# Patient Record
Sex: Male | Born: 1965 | Race: White | Hispanic: No | Marital: Married | Smoking: Former smoker
Health system: Southern US, Community
[De-identification: ages and names within clinical notes are randomized; demographics above are authoritative.]

## PROBLEM LIST (undated history)

## (undated) DIAGNOSIS — T148XXA Other injury of unspecified body region, initial encounter: Secondary | ICD-10-CM

## (undated) DIAGNOSIS — J189 Pneumonia, unspecified organism: Secondary | ICD-10-CM

## (undated) DIAGNOSIS — Z8489 Family history of other specified conditions: Secondary | ICD-10-CM

## (undated) DIAGNOSIS — I10 Essential (primary) hypertension: Secondary | ICD-10-CM

## (undated) HISTORY — PX: MULTIPLE TOOTH EXTRACTIONS: SHX2053

## (undated) HISTORY — DX: Essential (primary) hypertension: I10

---

## 2017-03-27 ENCOUNTER — Ambulatory Visit: Payer: Self-pay | Admitting: Internal Medicine

## 2017-03-27 DIAGNOSIS — I10 Essential (primary) hypertension: Secondary | ICD-10-CM | POA: Insufficient documentation

## 2017-03-27 DIAGNOSIS — E663 Overweight: Secondary | ICD-10-CM | POA: Insufficient documentation

## 2017-04-25 ENCOUNTER — Other Ambulatory Visit: Payer: Self-pay | Admitting: Internal Medicine

## 2017-04-26 ENCOUNTER — Other Ambulatory Visit: Payer: Self-pay

## 2017-05-03 ENCOUNTER — Ambulatory Visit: Payer: Self-pay | Admitting: Nurse Practitioner

## 2017-05-03 ENCOUNTER — Encounter: Payer: Self-pay | Admitting: Nurse Practitioner

## 2017-05-03 VITALS — BP 142/82 | HR 79 | Resp 16 | Ht 71.0 in | Wt 220.0 lb

## 2017-05-03 DIAGNOSIS — E782 Mixed hyperlipidemia: Secondary | ICD-10-CM

## 2017-05-03 DIAGNOSIS — I10 Essential (primary) hypertension: Secondary | ICD-10-CM

## 2017-05-03 MED ORDER — HYDROCHLOROTHIAZIDE 25 MG PO TABS: 25.0000 mg | ORAL_TABLET | Freq: Every morning | ORAL | 4 refills | Status: DC

## 2017-05-03 MED ORDER — LOSARTAN POTASSIUM 25 MG PO TABS: 25.0000 mg | ORAL_TABLET | Freq: Every morning | ORAL | 4 refills | Status: DC

## 2017-05-03 MED ORDER — AMLODIPINE BESYLATE 10 MG PO TABS: 10.0000 mg | ORAL_TABLET | Freq: Every day | ORAL | 4 refills | Status: DC

## 2017-05-03 NOTE — Progress Notes (Signed)
[facility] [address] [place], [place] [address]  Internal MEDICINE  Office Visit Note  Patient Name: Jordan Cross  [id number]  [id number]  Date of Service: 05/13/2017  Chief Complaint  Patient presents with  . Hypertension    Hypertension  This is a chronic problem. The current episode started more than 1 year ago. The problem is unchanged. The problem is controlled. Pertinent negatives include no chest pain, neck pain, palpitations or shortness of breath. Risk factors for coronary artery disease include obesity and stress. The current treatment provides moderate improvement. There are no compliance problems.     Pt is here for routine follow up.    Current Medication: Outpatient Encounter Medications as of 05/03/2017  Medication Sig  . hydrochlorothiazide (HYDRODIURIL) 25 MG tablet Take 1 tablet (25 mg total) by mouth every morning.  Marland Kitchen losartan (COZAAR) 25 MG tablet Take 1 tablet (25 mg total) by mouth every morning.  . [DISCONTINUED] hydrochlorothiazide (HYDRODIURIL) 25 MG tablet TAKE 1 TABLET BY MOUTH EVERY MORNING  . [DISCONTINUED] losartan (COZAAR) 25 MG tablet TAKE 1 TABLET BY MOUTH EVERY MORNING  . amLODipine (NORVASC) 10 MG tablet Take 1 tablet (10 mg total) by mouth daily.  . [DISCONTINUED] amLODipine (NORVASC) 10 MG tablet TAKE 1 TABLET BY MOUTH ONCE A DAY   No facility-administered encounter medications on file as of 05/03/2017.     Surgical History: History reviewed. No pertinent surgical history.  Medical History: Past Medical History:  Diagnosis Date  . Hypertension     Family History: No family history on file.  Social History   Socioeconomic History  . Marital status: Unknown    Spouse name: Not on file  . Number of children: Not on file  . Years of education: Not on file  . Highest education level: Not on file  Social Needs  . Financial resource strain: Not on file  . Food insecurity - worry: Not on file  . Food insecurity -  inability: Not on file  . Transportation needs - medical: Not on file  . Transportation needs - non-medical: Not on file  Occupational History  . Not on file  Tobacco Use  . Smoking status: Never Smoker  . Smokeless tobacco: Never Used  Substance and Sexual Activity  . Alcohol use: No    Frequency: Never  . Drug use: No  . Sexual activity: Not on file  Other Topics Concern  . Not on file  Social History Narrative  . Not on file      Review of Systems  Constitutional: Negative for activity change, chills, fatigue and unexpected weight change.  HENT: Negative for congestion, postnasal drip, rhinorrhea, sneezing and sore throat.   Eyes: Negative.  Negative for redness.  Respiratory: Negative for cough, chest tightness and shortness of breath.   Cardiovascular: Negative for chest pain and palpitations.       Blood pressure mildly elevated   Gastrointestinal: Negative for abdominal pain, constipation, diarrhea, nausea and vomiting.  Endocrine: Negative for cold intolerance, heat intolerance, polydipsia, polyphagia and polyuria.  Genitourinary: Negative for dysuria and frequency.  Musculoskeletal: Negative for arthralgias, back pain, joint swelling and neck pain.  Skin: Negative for rash.  Allergic/Immunologic: Negative for environmental allergies, food allergies and immunocompromised state.  Neurological: Negative.  Negative for tremors and numbness.  Hematological: Negative for adenopathy. Does not bruise/bleed easily.  Psychiatric/Behavioral: Negative for behavioral problems (Depression), sleep disturbance and suicidal ideas. The patient is not nervous/anxious.     Today's Vitals  05/03/17 1102 05/03/17 1123  BP: (!) 148/80 (!) 142/82  Pulse: 79   Resp: 16   SpO2: 95%   Weight: 220 lb (99.8 kg)   Height: 5\' 11"  (1.803 m)    Physical Exam  Constitutional: He is oriented to person, place, and time. He appears well-developed and well-nourished. No distress.  HENT:  Head:  Normocephalic and atraumatic.  Mouth/Throat: Oropharynx is clear and moist. No oropharyngeal exudate.  Eyes: EOM are normal. Pupils are equal, round, and reactive to light.  Neck: Normal range of motion. Neck supple. No JVD present. No tracheal deviation present. No thyromegaly present.  Cardiovascular: Normal rate, regular rhythm and normal heart sounds. Exam reveals no gallop and no friction rub.  No murmur heard. Pulmonary/Chest: Effort normal and breath sounds normal. No respiratory distress. He has no wheezes. He has no rales. He exhibits no tenderness.  Abdominal: Soft. Bowel sounds are normal. There is no tenderness.  Musculoskeletal: Normal range of motion.  Lymphadenopathy:    He has no cervical adenopathy.  Neurological: He is alert and oriented to person, place, and time. No cranial nerve deficit.  Skin: Skin is warm and dry. He is not diaphoretic.  Psychiatric: He has a normal mood and affect. His behavior is normal. Judgment and thought content normal.  Nursing note and vitals reviewed.   Assessment/Plan:  1. Essential hypertension Stable and improved on medication. Continue as prescribed. Refills provided today. - amLODipine (NORVASC) 10 MG tablet; Take 1 tablet (10 mg total) by mouth daily.  Dispense: 90 tablet; Refill: 4 - hydrochlorothiazide (HYDRODIURIL) 25 MG tablet; Take 1 tablet (25 mg total) by mouth every morning.  Dispense: 90 tablet; Refill: 4 - losartan (COZAAR) 25 MG tablet; Take 1 tablet (25 mg total) by mouth every morning.  Dispense: 90 tablet; Refill: 4  2. Mixed hyperlipidemia Reviewed labs showing mild increase of LDL and total cholesterol. Discussed lifestyle modifications to lower cholesterol without channging medications. Will recheck in 6-12 months.   General Counseling: Jordan Cross verbalizes understanding of the findings of todays visit and agrees with plan of treatment. I have discussed any further diagnostic evaluation that may be needed or ordered today.  We also reviewed his medications today. he has been encouraged to call the office with any questions or concerns that should arise related to todays visit.   Hypertension Counseling:   The following hypertensive lifestyle modification were recommended and discussed:  1. Limiting alcohol intake to less than 1 oz/day of ethanol:(24 oz of beer or 8 oz of wine or 2 oz of 100-proof whiskey). 2. Take baby ASA 81 mg daily. 3. Importance of regular aerobic exercise and losing weight. 4. Reduce dietary saturated fat and cholesterol intake for overall cardiovascular health. 5. Maintaining adequate dietary potassium, calcium, and magnesium intake. 6. Regular monitoring of the blood pressure. 7. Reduce sodium intake to less than 100 mmol/day (less than 2.3 gm of sodium or less than 6 gm of sodium choride)    This patient was seen by Vincent Gros, FNP- C in Collaboration with Dr Lyndon Code as a part of collaborative care agreement  Meds ordered this encounter  Medications  . amLODipine (NORVASC) 10 MG tablet    Sig: Take 1 tablet (10 mg total) by mouth daily.    Dispense:  90 tablet    Refill:  4    Order Specific Question:   Supervising Provider    Answer:   Lyndon Code [[id number]]  . hydrochlorothiazide (HYDRODIURIL) 25 MG tablet  Sig: Take 1 tablet (25 mg total) by mouth every morning.    Dispense:  90 tablet    Refill:  4    Order Specific Question:   Supervising Provider    Answer:   Lyndon CodeKHAN, FOZIA M [[id number]]  . losartan (COZAAR) 25 MG tablet    Sig: Take 1 tablet (25 mg total) by mouth every morning.    Dispense:  90 tablet    Refill:  4    Order Specific Question:   Supervising Provider    Answer:   Lyndon CodeKHAN, FOZIA M [[id number]]    Time spent:15 Minutes   Dr Lyndon CodeFozia M Khan Internal medicine

## 2017-05-13 DIAGNOSIS — E782 Mixed hyperlipidemia: Secondary | ICD-10-CM | POA: Insufficient documentation

## 2017-11-06 ENCOUNTER — Ambulatory Visit: Payer: Self-pay | Admitting: Adult Health

## 2017-11-20 ENCOUNTER — Emergency Department (HOSPITAL_COMMUNITY): Payer: Self-pay

## 2017-11-20 ENCOUNTER — Encounter (HOSPITAL_COMMUNITY): Payer: Self-pay | Admitting: *Deleted

## 2017-11-20 ENCOUNTER — Emergency Department (HOSPITAL_COMMUNITY)
Admission: EM | Admit: 2017-11-20 | Discharge: 2017-11-20 | Disposition: A | Discharge status: Home / Self Care | Payer: Self-pay | Attending: Emergency Medicine | Admitting: Emergency Medicine

## 2017-11-20 DIAGNOSIS — Y939 Activity, unspecified: Secondary | ICD-10-CM | POA: Insufficient documentation

## 2017-11-20 DIAGNOSIS — Z79899 Other long term (current) drug therapy: Secondary | ICD-10-CM | POA: Insufficient documentation

## 2017-11-20 DIAGNOSIS — S82844A Nondisplaced bimalleolar fracture of right lower leg, initial encounter for closed fracture: Secondary | ICD-10-CM | POA: Insufficient documentation

## 2017-11-20 DIAGNOSIS — Y929 Unspecified place or not applicable: Secondary | ICD-10-CM | POA: Insufficient documentation

## 2017-11-20 DIAGNOSIS — I10 Essential (primary) hypertension: Secondary | ICD-10-CM | POA: Insufficient documentation

## 2017-11-20 DIAGNOSIS — S82841A Displaced bimalleolar fracture of right lower leg, initial encounter for closed fracture: Secondary | ICD-10-CM

## 2017-11-20 DIAGNOSIS — Y999 Unspecified external cause status: Secondary | ICD-10-CM | POA: Insufficient documentation

## 2017-11-20 DIAGNOSIS — W11XXXA Fall on and from ladder, initial encounter: Secondary | ICD-10-CM | POA: Insufficient documentation

## 2017-11-20 MED ORDER — OXYCODONE-ACETAMINOPHEN 5-325 MG PO TABS
1.0000 | ORAL_TABLET | Freq: Once | ORAL | Status: AC
  Administered 2017-11-20: 1 via ORAL
  Filled 2017-11-20: qty 1

## 2017-11-20 MED ORDER — OXYCODONE-ACETAMINOPHEN 5-325 MG PO TABS: 1.0000 | ORAL_TABLET | ORAL | 0 refills | Status: AC | PRN

## 2017-11-20 NOTE — ED Notes (Signed)
Declined W/C at D/C and was escorted to lobby by RN. 

## 2017-11-20 NOTE — ED Triage Notes (Signed)
Pt has not actually arrived to hospital

## 2017-11-20 NOTE — Consult Note (Signed)
Reason for Consult:Right ankle fx Referring Physician: P Messick  Jordan Cross is an 52 y.o. male.  HPI: Jordan Cross was on a ladder and fell about 7 feet. He had immediate pain and came to the ED for evaluation. X-rays showed a bimalleolar ankle fx and orthopedic surgery was consulted.  Past Medical History:  Diagnosis Date  . Hypertension     History reviewed. No pertinent surgical history.  History reviewed. No pertinent family history.  Social History:  reports that he has never smoked. He has never used smokeless tobacco. He reports that he does not drink alcohol or use drugs.  Allergies:  Allergies  Allergen Reactions  . Penicillins     Medications: I have reviewed the patient's current medications.  No results found for this or any previous visit (from the past 48 hour(s)).  Dg Tibia/fibula Right  Result Date: 11/20/2017 CLINICAL DATA:  Pt c/o generalized right ankle pain and swelling and distal lower right leg pain and swelling after falling off a 7 foot ladder today. Hx of prior right ankle fracture. EXAM: RIGHT TIBIA AND FIBULA - 2 VIEW COMPARISON:  None. FINDINGS: Avulsion fracture of the medial malleolus, major fracture fragment distracted approximately 6 mm. Fracture of the lateral malleolus below the level of the tibiotalar articulation, fragment displaced approximately 5 mm. Lateral and medial soft tissue swelling. Posterior malleolus appears intact. The ankle mortise appears intact on these nonstress views. Small calcaneal spurs.Small marginal spurs from the patellar articular surface. Proximal tibia and fibula intact. IMPRESSION: 1. Distracted medial malleolus fracture. 2. Minimally displaced fracture of the distal lateral malleolus. Electronically Signed   By: Corlis Leak M.D.   On: 11/20/2017 15:46   Dg Ankle Complete Right  Result Date: 11/20/2017 CLINICAL DATA:  Pt c/o generalized right ankle pain and swelling and distal lower right leg pain and swelling after falling off a  7 foot ladder today. Hx of prior right ankle fracture. EXAM: RIGHT ANKLE - COMPLETE 3+ VIEW COMPARISON:  None available FINDINGS: Avulsion fracture of the medial malleolus, major fracture fragment distracted approximately 6 mm. Fracture of the lateral malleolus below the level of the tibiotalar articulation, fragment displaced approximately 5 mm. Lateral and medial soft tissue swelling. Posterior malleolus appears intact. The ankle mortise appears intact on these nonstress views. Small calcaneal spurs. IMPRESSION: 1. Distracted medial malleolus  fracture. 2. Minimally displaced fracture of the distal lateral malleolus. Electronically Signed   By: Corlis Leak M.D.   On: 11/20/2017 15:39    Review of Systems  Constitutional: Negative for weight loss.  HENT: Negative for ear discharge, ear pain, hearing loss and tinnitus.   Eyes: Negative for blurred vision, double vision, photophobia and pain.  Respiratory: Negative for cough, sputum production and shortness of breath.   Cardiovascular: Negative for chest pain.  Gastrointestinal: Negative for abdominal pain, nausea and vomiting.  Genitourinary: Negative for dysuria, flank pain, frequency and urgency.  Musculoskeletal: Positive for joint pain (Right ankle). Negative for back pain, falls, myalgias and neck pain.  Neurological: Negative for dizziness, tingling, sensory change, focal weakness, loss of consciousness and headaches.  Endo/Heme/Allergies: Does not bruise/bleed easily.  Psychiatric/Behavioral: Negative for depression, memory loss and substance abuse. The patient is not nervous/anxious.    Blood pressure 113/83, pulse 77, temperature 98.1 F (36.7 C), temperature source Oral, resp. rate 16, height 5\' 10"  (1.778 m), weight 95.3 kg, SpO2 100 %. Physical Exam  Constitutional: He appears well-developed and well-nourished. No distress.  HENT:  Head: Normocephalic and atraumatic.  Eyes: Conjunctivae are normal. Right eye exhibits no discharge.  Left eye exhibits no discharge. No scleral icterus.  Neck: Normal range of motion.  Cardiovascular: Normal rate and regular rhythm.  Respiratory: Effort normal. No respiratory distress.  Musculoskeletal:  RLE No traumatic wounds, ecchymosis, or rash  Ankle TTP, severe edema  No knee effusion  Knee stable to varus/ valgus and anterior/posterior stress  Sens DPN, SPN, TN intact  Motor EHL, ext, flex, evers 5/5  DP 2+  Neurological: He is alert.  Skin: Skin is warm and dry. He is not diaphoretic.  Psychiatric: He has a normal mood and affect. His behavior is normal.    Assessment/Plan: Right ankle fx -- Short leg splint and NWB with crutches. F/u with Dr. Carola Frost next week.     Freeman Caldron, PA-C Orthopedic Surgery [phone] 11/20/2017, 4:13 PM

## 2017-11-20 NOTE — Discharge Instructions (Addendum)
Keep splint intact and dry.  Do not put weight down on right foot.  Keep ankle elevated whenever possible.  Ice (over splint) to ankle for 30 minutes 4x/day through Thursday.

## 2017-11-20 NOTE — ED Triage Notes (Signed)
Pt in after a fall from a ladder and landed on his right ankle, swelling noted, denies hitting head or LOC

## 2017-11-20 NOTE — ED Provider Notes (Addendum)
[facility] EMERGENCY DEPARTMENT Provider Note   CSN: [id number] Arrival date & time: 11/20/17  1352     History   Chief Complaint Chief Complaint  Patient presents with  . Fall    HPI Jordan Cross is a 52 y.o. male.  52 y/o male with a PMH of HTN presents to the ED s/p fall from 6 foot ladder x 2 hours.  Patient was standing on his ladder when he felt the ladder gave out from under him causing him to fall on the right side of his body as the ladder flipped on top of him he states most of his right ankle caught the fall.  He reports right ankle pain, patient is unable to weight-bear.  Reports has not taken occasion for symptomatic relief.  Denies hitting his head, LOC, shortness of breath or other complaints.       Past Medical History:  Diagnosis Date  . Hypertension     Patient Active Problem List   Diagnosis Date Noted  . Mixed hyperlipidemia 05/13/2017  . Overweight 03/27/2017  . Essential hypertension 03/27/2017    History reviewed. No pertinent surgical history.      Home Medications    Prior to Admission medications   Medication Sig Start Date End Date Taking? Authorizing Provider  amLODipine (NORVASC) 10 MG tablet Take 1 tablet (10 mg total) by mouth daily. 05/03/17   Carlean Jews, NP  hydrochlorothiazide (HYDRODIURIL) 25 MG tablet Take 1 tablet (25 mg total) by mouth every morning. 05/03/17   Carlean Jews, NP  losartan (COZAAR) 25 MG tablet Take 1 tablet (25 mg total) by mouth every morning. 05/03/17   Carlean Jews, NP  oxyCODONE-acetaminophen (PERCOCET/ROXICET) 5-325 MG tablet Take 1 tablet by mouth every 4 (four) hours as needed for up to 3 days for severe pain. 11/20/17 11/23/17  Claude Manges, PA-C    Family History History reviewed. No pertinent family history.  Social History Social History   Tobacco Use  . Smoking status: Never Smoker  . Smokeless tobacco: Never Used  Substance Use Topics  . Alcohol use: No   Frequency: Never  . Drug use: No     Allergies   Penicillins   Review of Systems Review of Systems  Musculoskeletal: Positive for arthralgias, joint swelling and myalgias.  Neurological: Negative for light-headedness.  All other systems reviewed and are negative.    Physical Exam Updated Vital Signs BP 113/83 (BP Location: Left Arm)   Pulse 77   Temp 98.1 F (36.7 C) (Oral)   Resp 16   Ht 5\' 10"  (1.778 m)   Wt 95.3 kg   SpO2 100%   BMI 30.13 kg/m   Physical Exam  Constitutional: He appears well-developed and well-nourished.  HENT:  Head: Normocephalic and atraumatic.  Eyes: Pupils are equal, round, and reactive to light.  Neck: Normal range of motion. Neck supple.  Cardiovascular: Normal heart sounds.  Pulmonary/Chest: Effort normal and breath sounds normal. He has no wheezes.  Abdominal: Soft.  Musculoskeletal: He exhibits tenderness and deformity.       Right ankle: He exhibits swelling and deformity. He exhibits normal range of motion, no ecchymosis, no laceration and normal pulse.       Feet:  Patient exhibits pain with range of motion.  Is present.  Capillary refill intact.  Compartment is soft.  Neurological: He is alert.  Skin: Skin is warm and dry. Capillary refill takes less than 2 seconds.  Nursing note and  vitals reviewed.    ED Treatments / Results  Labs (all labs ordered are listed, but only abnormal results are displayed) Labs Reviewed - No data to display  EKG None  Radiology Dg Tibia/fibula Right  Result Date: 11/20/2017 CLINICAL DATA:  Pt c/o generalized right ankle pain and swelling and distal lower right leg pain and swelling after falling off a 7 foot ladder today. Hx of prior right ankle fracture. EXAM: RIGHT TIBIA AND FIBULA - 2 VIEW COMPARISON:  None. FINDINGS: Avulsion fracture of the medial malleolus, major fracture fragment distracted approximately 6 mm. Fracture of the lateral malleolus below the level of the tibiotalar  articulation, fragment displaced approximately 5 mm. Lateral and medial soft tissue swelling. Posterior malleolus appears intact. The ankle mortise appears intact on these nonstress views. Small calcaneal spurs.Small marginal spurs from the patellar articular surface. Proximal tibia and fibula intact. IMPRESSION: 1. Distracted medial malleolus fracture. 2. Minimally displaced fracture of the distal lateral malleolus. Electronically Signed   By: Corlis Leak M.D.   On: 11/20/2017 15:46   Dg Ankle Complete Right  Result Date: 11/20/2017 CLINICAL DATA:  Pt c/o generalized right ankle pain and swelling and distal lower right leg pain and swelling after falling off a 7 foot ladder today. Hx of prior right ankle fracture. EXAM: RIGHT ANKLE - COMPLETE 3+ VIEW COMPARISON:  None available FINDINGS: Avulsion fracture of the medial malleolus, major fracture fragment distracted approximately 6 mm. Fracture of the lateral malleolus below the level of the tibiotalar articulation, fragment displaced approximately 5 mm. Lateral and medial soft tissue swelling. Posterior malleolus appears intact. The ankle mortise appears intact on these nonstress views. Small calcaneal spurs. IMPRESSION: 1. Distracted medial malleolus  fracture. 2. Minimally displaced fracture of the distal lateral malleolus. Electronically Signed   By: Corlis Leak M.D.   On: 11/20/2017 15:39    Procedures Procedures (including critical care time)  Medications Ordered in ED Medications  oxyCODONE-acetaminophen (PERCOCET/ROXICET) 5-325 MG per tablet 1 tablet (1 tablet Oral Given 11/20/17 1517)     Initial Impression / Assessment and Plan / ED Course  I have reviewed the triage vital signs and the nursing notes.  Pertinent labs & imaging results that were available during my care of the patient were reviewed by me and considered in my medical decision making (see chart for details).     Patient presents s/p fall from a ladder this afternoon. Upon  examination patient is  Reports swelling and bruising noted along right ankle. He is unable to ambulate at this time,patient received percocet for pain in while waiting for imaging.  DG right ankle showed showed distracted medial malleolus fracture and minimally displaced fracture of the distal lateral malleolus.  Time I have placed a call to Dr. Eston Esters PA Casimiro Needle to come in and evaluate patient.  4:05 PM Spoke to Charma Igo PA to come in and evaluate patient in the ED. 4:41 PM Charma Igo PA has ordered patient a short splint and crutches,patient will need to see Dr. Susette Racer next week in office.    Final Clinical Impressions(s) / ED Diagnoses   Final diagnoses:  Ankle fracture, bimalleolar, closed, right, initial encounter    ED Discharge Orders         Ordered    oxyCODONE-acetaminophen (PERCOCET/ROXICET) 5-325 MG tablet  Every 4 hours PRN     11/20/17 1701           Claude Manges, PA-C 11/20/17 1658    Claude Manges, PA-C 11/20/17  [address]    Claude Manges, PA-C 11/20/17 1701    Rolan Bucco, MD 11/20/17 [phone]

## 2017-11-20 NOTE — Progress Notes (Signed)
Orthopedic Tech Progress Note Patient Details:  Jordan Cross Dec 15, 1965 [id number]  Ortho Devices Type of Ortho Device: Ace wrap, Post (short leg) splint, Stirrup splint, Crutches Ortho Device/Splint Interventions: Application   Post Interventions Patient Tolerated: Well Instructions Provided: Care of device   Saul Fordyce 11/20/2017, 5:10 PM

## 2017-12-10 ENCOUNTER — Other Ambulatory Visit: Payer: Self-pay

## 2017-12-10 ENCOUNTER — Encounter (HOSPITAL_COMMUNITY): Payer: Self-pay | Admitting: *Deleted

## 2017-12-10 NOTE — Progress Notes (Signed)
Pt denies SOB, chest pain, and being under the care of a cardiologist. Pt stated that a stress test was performed " 20 years ago." Pt denies having an echo and cardiac cath. Pt denies having an EKG and chest x ray within the last year. Pt denies recent labs. Pt made aware to stop taking vitamins, fish oil and herbal medications. Do not take any NSAIDs ie: Ibuprofen, Advil, Naproxen (Aleve), Motrin, BC and Goody Powder. Pt stated that he does not take Aspirin. Pt verbalized understanding of all pre-op instructions.

## 2017-12-10 NOTE — Anesthesia Preprocedure Evaluation (Addendum)
Anesthesia Evaluation  Patient identified by MRN, date of birth, ID band Patient awake    Reviewed: Allergy & Precautions, NPO status , Patient's Chart, lab work & pertinent test results  History of Anesthesia Complications Negative for: history of anesthetic complications  Airway Mallampati: II  TM Distance: >3 FB Neck ROM: Full    Dental  (+) Dental Advisory Given, Chipped,    Pulmonary neg pulmonary ROS,    breath sounds clear to auscultation       Cardiovascular hypertension, Pt. on medications  Rhythm:Regular Rate:Normal     Neuro/Psych negative neurological ROS  negative psych ROS   GI/Hepatic negative GI ROS, Neg liver ROS,   Endo/Other   Obesity   Renal/GU negative Renal ROS  negative genitourinary   Musculoskeletal negative musculoskeletal ROS (+)   Abdominal   Peds  Hematology negative hematology ROS (+)   Anesthesia Other Findings   Reproductive/Obstetrics                           Anesthesia Physical Anesthesia Plan  ASA: II  Anesthesia Plan: General   Post-op Pain Management:  Regional for Post-op pain   Induction: Intravenous  PONV Risk Score and Plan: 2 and Treatment may vary due to age or medical condition, Ondansetron, Dexamethasone and Midazolam  Airway Management Planned: LMA  Additional Equipment: None  Intra-op Plan:   Post-operative Plan: Extubation in OR  Informed Consent: I have reviewed the patients History and Physical, chart, labs and discussed the procedure including the risks, benefits and alternatives for the proposed anesthesia with the patient or authorized representative who has indicated his/her understanding and acceptance.   Dental advisory given  Plan Discussed with: CRNA and Anesthesiologist  Anesthesia Plan Comments:        Anesthesia Quick Evaluation

## 2017-12-11 ENCOUNTER — Ambulatory Visit (HOSPITAL_COMMUNITY): Payer: Self-pay

## 2017-12-11 ENCOUNTER — Encounter (HOSPITAL_COMMUNITY)
Admission: RE | Disposition: A | Discharge status: Home / Self Care | Payer: Self-pay | Source: Ambulatory Visit | Attending: Orthopedic Surgery

## 2017-12-11 ENCOUNTER — Ambulatory Visit (HOSPITAL_COMMUNITY): Payer: Self-pay | Admitting: Anesthesiology

## 2017-12-11 ENCOUNTER — Encounter (HOSPITAL_COMMUNITY): Payer: Self-pay | Admitting: *Deleted

## 2017-12-11 ENCOUNTER — Ambulatory Visit (HOSPITAL_COMMUNITY)
Admission: RE | Admit: 2017-12-11 | Discharge: 2017-12-11 | Disposition: A | Discharge status: Home / Self Care | Payer: Self-pay | Source: Ambulatory Visit | Attending: Orthopedic Surgery | Admitting: Orthopedic Surgery

## 2017-12-11 DIAGNOSIS — S82871A Displaced pilon fracture of right tibia, initial encounter for closed fracture: Secondary | ICD-10-CM | POA: Insufficient documentation

## 2017-12-11 DIAGNOSIS — S82844A Nondisplaced bimalleolar fracture of right lower leg, initial encounter for closed fracture: Secondary | ICD-10-CM | POA: Insufficient documentation

## 2017-12-11 DIAGNOSIS — Z419 Encounter for procedure for purposes other than remedying health state, unspecified: Secondary | ICD-10-CM

## 2017-12-11 DIAGNOSIS — W11XXXA Fall on and from ladder, initial encounter: Secondary | ICD-10-CM | POA: Insufficient documentation

## 2017-12-11 DIAGNOSIS — Z6830 Body mass index (BMI) 30.0-30.9, adult: Secondary | ICD-10-CM | POA: Insufficient documentation

## 2017-12-11 DIAGNOSIS — S92191A Other fracture of right talus, initial encounter for closed fracture: Secondary | ICD-10-CM | POA: Insufficient documentation

## 2017-12-11 DIAGNOSIS — E669 Obesity, unspecified: Secondary | ICD-10-CM | POA: Insufficient documentation

## 2017-12-11 HISTORY — DX: Family history of other specified conditions: Z84.89

## 2017-12-11 HISTORY — DX: Other injury of unspecified body region, initial encounter: T14.8XXA

## 2017-12-11 HISTORY — PX: ORIF ANKLE FRACTURE: SHX5408

## 2017-12-11 HISTORY — DX: Pneumonia, unspecified organism: J18.9

## 2017-12-11 LAB — CBC
HEMATOCRIT: 46.2 % (ref 39.0–52.0)
Hemoglobin: 15.8 g/dL (ref 13.0–17.0)
MCH: 30 pg (ref 26.0–34.0)
MCHC: 34.2 g/dL (ref 30.0–36.0)
MCV: 87.7 fL (ref 78.0–100.0)
Platelets: 287 10*3/uL (ref 150–400)
RBC: 5.27 MIL/uL (ref 4.22–5.81)
RDW: 11.5 % (ref 11.5–15.5)
WBC: 8.2 10*3/uL (ref 4.0–10.5)

## 2017-12-11 SURGERY — OPEN REDUCTION INTERNAL FIXATION (ORIF) ANKLE FRACTURE
Anesthesia: General | Laterality: Right

## 2017-12-11 MED ORDER — ACETAMINOPHEN 500 MG PO TABS: 500.0000 mg | ORAL_TABLET | Freq: Three times a day (TID) | ORAL | 0 refills | Status: DC

## 2017-12-11 MED ORDER — ONDANSETRON HCL 4 MG/2ML IJ SOLN
INTRAMUSCULAR | Status: AC
  Filled 2017-12-11: qty 2

## 2017-12-11 MED ORDER — PHENYLEPHRINE 40 MCG/ML (10ML) SYRINGE FOR IV PUSH (FOR BLOOD PRESSURE SUPPORT)
PREFILLED_SYRINGE | INTRAVENOUS | Status: DC | PRN
  Administered 2017-12-11: 80 ug via INTRAVENOUS
  Administered 2017-12-11 (×2): 40 ug via INTRAVENOUS
  Administered 2017-12-11 (×2): 120 ug via INTRAVENOUS

## 2017-12-11 MED ORDER — DEXAMETHASONE SODIUM PHOSPHATE 10 MG/ML IJ SOLN
INTRAMUSCULAR | Status: DC | PRN
  Administered 2017-12-11: 10 mg via INTRAVENOUS

## 2017-12-11 MED ORDER — SCOPOLAMINE 1 MG/3DAYS TD PT72
MEDICATED_PATCH | TRANSDERMAL | Status: DC | PRN
  Administered 2017-12-11: 1 via TRANSDERMAL

## 2017-12-11 MED ORDER — ONDANSETRON 4 MG PO TBDP: 4.0000 mg | ORAL_TABLET | Freq: Three times a day (TID) | ORAL | 0 refills | Status: DC | PRN

## 2017-12-11 MED ORDER — ACETAMINOPHEN 500 MG PO TABS
1000.0000 mg | ORAL_TABLET | Freq: Once | ORAL | Status: AC
  Administered 2017-12-11: 1000 mg via ORAL
  Filled 2017-12-11: qty 2

## 2017-12-11 MED ORDER — MIDAZOLAM HCL 2 MG/2ML IJ SOLN
INTRAMUSCULAR | Status: AC
  Filled 2017-12-11: qty 2

## 2017-12-11 MED ORDER — FENTANYL CITRATE (PF) 100 MCG/2ML IJ SOLN
INTRAMUSCULAR | Status: DC | PRN
  Administered 2017-12-11: 50 ug via INTRAVENOUS
  Administered 2017-12-11: 100 ug via INTRAVENOUS

## 2017-12-11 MED ORDER — PHENYLEPHRINE 40 MCG/ML (10ML) SYRINGE FOR IV PUSH (FOR BLOOD PRESSURE SUPPORT)
PREFILLED_SYRINGE | INTRAVENOUS | Status: AC
  Filled 2017-12-11: qty 10

## 2017-12-11 MED ORDER — ONDANSETRON HCL 4 MG/2ML IJ SOLN
INTRAMUSCULAR | Status: DC | PRN
  Administered 2017-12-11: 4 mg via INTRAVENOUS

## 2017-12-11 MED ORDER — OXYCODONE HCL 5 MG PO TABS: 5.0000 mg | ORAL_TABLET | Freq: Once | ORAL | Status: DC | PRN

## 2017-12-11 MED ORDER — PROMETHAZINE HCL 25 MG/ML IJ SOLN: 6.2500 mg | INTRAMUSCULAR | Status: DC | PRN

## 2017-12-11 MED ORDER — SCOPOLAMINE 1 MG/3DAYS TD PT72
MEDICATED_PATCH | TRANSDERMAL | Status: AC
  Filled 2017-12-11: qty 1

## 2017-12-11 MED ORDER — PROPOFOL 10 MG/ML IV BOLUS
INTRAVENOUS | Status: AC
  Filled 2017-12-11: qty 20

## 2017-12-11 MED ORDER — LACTATED RINGERS IV SOLN
INTRAVENOUS | Status: DC
  Administered 2017-12-11 (×2): via INTRAVENOUS

## 2017-12-11 MED ORDER — CHLORHEXIDINE GLUCONATE 4 % EX LIQD: 60.0000 mL | Freq: Once | CUTANEOUS | Status: DC

## 2017-12-11 MED ORDER — FENTANYL CITRATE (PF) 100 MCG/2ML IJ SOLN
50.0000 ug | Freq: Once | INTRAMUSCULAR | Status: AC
  Administered 2017-12-11: 50 ug via INTRAVENOUS

## 2017-12-11 MED ORDER — GABAPENTIN 300 MG PO CAPS
300.0000 mg | ORAL_CAPSULE | Freq: Once | ORAL | Status: AC
  Administered 2017-12-11: 300 mg via ORAL
  Filled 2017-12-11: qty 1

## 2017-12-11 MED ORDER — DEXAMETHASONE SODIUM PHOSPHATE 10 MG/ML IJ SOLN
INTRAMUSCULAR | Status: AC
  Filled 2017-12-11: qty 1

## 2017-12-11 MED ORDER — OXYCODONE HCL 5 MG/5ML PO SOLN: 5.0000 mg | Freq: Once | ORAL | Status: DC | PRN

## 2017-12-11 MED ORDER — LIDOCAINE 2% (20 MG/ML) 5 ML SYRINGE
INTRAMUSCULAR | Status: DC | PRN
  Administered 2017-12-11: 80 mg via INTRAVENOUS

## 2017-12-11 MED ORDER — 0.9 % SODIUM CHLORIDE (POUR BTL) OPTIME
TOPICAL | Status: DC | PRN
  Administered 2017-12-11: 1000 mL

## 2017-12-11 MED ORDER — PROPOFOL 10 MG/ML IV BOLUS
INTRAVENOUS | Status: DC | PRN
  Administered 2017-12-11: 200 mg via INTRAVENOUS
  Administered 2017-12-11 (×2): 50 mg via INTRAVENOUS

## 2017-12-11 MED ORDER — FENTANYL CITRATE (PF) 100 MCG/2ML IJ SOLN
INTRAMUSCULAR | Status: AC
  Administered 2017-12-11: 50 ug via INTRAVENOUS
  Filled 2017-12-11: qty 2

## 2017-12-11 MED ORDER — MIDAZOLAM HCL 2 MG/2ML IJ SOLN
2.0000 mg | Freq: Once | INTRAMUSCULAR | Status: AC
  Administered 2017-12-11: 2 mg via INTRAVENOUS

## 2017-12-11 MED ORDER — HYDROCODONE-ACETAMINOPHEN 7.5-325 MG PO TABS: 1.0000 | ORAL_TABLET | Freq: Three times a day (TID) | ORAL | 0 refills | Status: DC | PRN

## 2017-12-11 MED ORDER — METHOCARBAMOL 500 MG PO TABS: 500.0000 mg | ORAL_TABLET | Freq: Four times a day (QID) | ORAL | 0 refills | Status: DC | PRN

## 2017-12-11 MED ORDER — FENTANYL CITRATE (PF) 250 MCG/5ML IJ SOLN
INTRAMUSCULAR | Status: AC
  Filled 2017-12-11: qty 5

## 2017-12-11 MED ORDER — FENTANYL CITRATE (PF) 100 MCG/2ML IJ SOLN: 25.0000 ug | INTRAMUSCULAR | Status: DC | PRN

## 2017-12-11 MED ORDER — BUPIVACAINE-EPINEPHRINE (PF) 0.5% -1:200000 IJ SOLN
INTRAMUSCULAR | Status: DC | PRN
  Administered 2017-12-11: 15 mL via PERINEURAL
  Administered 2017-12-11: 25 mL via PERINEURAL

## 2017-12-11 MED ORDER — LIDOCAINE 2% (20 MG/ML) 5 ML SYRINGE
INTRAMUSCULAR | Status: AC
  Filled 2017-12-11: qty 5

## 2017-12-11 MED ORDER — MIDAZOLAM HCL 2 MG/2ML IJ SOLN
INTRAMUSCULAR | Status: AC
  Administered 2017-12-11: 2 mg via INTRAVENOUS
  Filled 2017-12-11: qty 2

## 2017-12-11 MED ORDER — CEFAZOLIN SODIUM-DEXTROSE 2-4 GM/100ML-% IV SOLN
2.0000 g | INTRAVENOUS | Status: AC
  Administered 2017-12-11: 2 g via INTRAVENOUS
  Filled 2017-12-11: qty 100

## 2017-12-11 MED ORDER — ASPIRIN EC 325 MG PO TBEC: 325.0000 mg | DELAYED_RELEASE_TABLET | Freq: Every day | ORAL | 0 refills | Status: DC

## 2017-12-11 MED ORDER — SODIUM CHLORIDE 0.9 % IV SOLN
INTRAVENOUS | Status: DC | PRN
  Administered 2017-12-11: 25 ug/min via INTRAVENOUS
  Administered 2017-12-11: 11:00:00 via INTRAVENOUS

## 2017-12-11 SURGICAL SUPPLY — 75 items
BANDAGE ACE 4X5 VEL STRL LF (GAUZE/BANDAGES/DRESSINGS) IMPLANT
BANDAGE ACE 6X5 VEL STRL LF (GAUZE/BANDAGES/DRESSINGS) ×3 IMPLANT
BANDAGE ESMARK 6X9 LF (GAUZE/BANDAGES/DRESSINGS) ×1 IMPLANT
BIT DRILL 2.4X140 LONG SOLID (BIT) ×3 IMPLANT
BIT DRILL 2.5X2.75 QC CALB (BIT) ×3 IMPLANT
BIT DRILL 3.5X5.5 QC CALB (BIT) ×3 IMPLANT
BIT DRILL CANNULTD 2.6 X 130MM (DRILL) ×1 IMPLANT
BNDG ESMARK 6X9 LF (GAUZE/BANDAGES/DRESSINGS) ×3
BNDG GAUZE ELAST 4 BULKY (GAUZE/BANDAGES/DRESSINGS) ×3 IMPLANT
BRUSH SCRUB SURG 4.25 DISP (MISCELLANEOUS) ×6 IMPLANT
COVER MAYO STAND STRL (DRAPES) ×3 IMPLANT
COVER SURGICAL LIGHT HANDLE (MISCELLANEOUS) ×3 IMPLANT
DRAPE C-ARM 42X72 X-RAY (DRAPES) ×3 IMPLANT
DRAPE C-ARMOR (DRAPES) ×3 IMPLANT
DRAPE HALF SHEET 40X57 (DRAPES) ×3 IMPLANT
DRAPE U-SHAPE 47X51 STRL (DRAPES) ×3 IMPLANT
DRILL CANNULATED 2.6 X 130MM (DRILL) ×3
DRSG EMULSION OIL 3X3 NADH (GAUZE/BANDAGES/DRESSINGS) ×3 IMPLANT
ELECT REM PT RETURN 9FT ADLT (ELECTROSURGICAL) ×3
ELECTRODE REM PT RTRN 9FT ADLT (ELECTROSURGICAL) ×1 IMPLANT
GAUZE SPONGE 4X4 12PLY STRL (GAUZE/BANDAGES/DRESSINGS) ×6 IMPLANT
GAUZE SPONGE 4X4 12PLY STRL LF (GAUZE/BANDAGES/DRESSINGS) ×3 IMPLANT
GLOVE BIO SURGEON STRL SZ7.5 (GLOVE) ×3 IMPLANT
GLOVE BIO SURGEON STRL SZ8 (GLOVE) ×3 IMPLANT
GLOVE BIOGEL PI IND STRL 7.5 (GLOVE) ×1 IMPLANT
GLOVE BIOGEL PI IND STRL 8 (GLOVE) ×1 IMPLANT
GLOVE BIOGEL PI INDICATOR 7.5 (GLOVE) ×2
GLOVE BIOGEL PI INDICATOR 8 (GLOVE) ×2
GOWN STRL REUS W/ TWL LRG LVL3 (GOWN DISPOSABLE) ×2 IMPLANT
GOWN STRL REUS W/ TWL XL LVL3 (GOWN DISPOSABLE) ×1 IMPLANT
GOWN STRL REUS W/TWL LRG LVL3 (GOWN DISPOSABLE) ×4
GOWN STRL REUS W/TWL XL LVL3 (GOWN DISPOSABLE) ×2
K-WIRE SNGL END 1.2X150 (MISCELLANEOUS) ×6
KIT BASIN OR (CUSTOM PROCEDURE TRAY) ×3 IMPLANT
KIT TURNOVER KIT B (KITS) ×3 IMPLANT
KWIRE SNGL END 1.2X150 (MISCELLANEOUS) ×2 IMPLANT
MANIFOLD NEPTUNE II (INSTRUMENTS) ×3 IMPLANT
NEEDLE HYPO 21X1.5 SAFETY (NEEDLE) IMPLANT
NS IRRIG 1000ML POUR BTL (IV SOLUTION) ×3 IMPLANT
PACK GENERAL/GYN (CUSTOM PROCEDURE TRAY) ×3 IMPLANT
PACK ORTHO EXTREMITY (CUSTOM PROCEDURE TRAY) ×3 IMPLANT
PAD ARMBOARD 7.5X6 YLW CONV (MISCELLANEOUS) ×6 IMPLANT
PAD CAST 4YDX4 CTTN HI CHSV (CAST SUPPLIES) ×1 IMPLANT
PADDING CAST COTTON 4X4 STRL (CAST SUPPLIES) ×2
PADDING CAST COTTON 6X4 STRL (CAST SUPPLIES) IMPLANT
PLATE ACE META (Plate) ×2 IMPLANT
PLATE ACE SM 71.4X30.5X1X3 HL (Plate) ×1 IMPLANT
PLATE MEDIAL MALLEOLUS 2H RT (Plate) ×3 IMPLANT
SCREW CANN NL 3.5X40 (Screw) ×3 IMPLANT
SCREW CANN SHT THR 4X34 (Screw) ×6 IMPLANT
SCREW CORT FT 32X3.5XNONLOCK (Screw) ×1 IMPLANT
SCREW CORTICAL 3.5MM  30MM (Screw) ×2 IMPLANT
SCREW CORTICAL 3.5MM  32MM (Screw) ×2 IMPLANT
SCREW CORTICAL 3.5MM  44MM (Screw) ×2 IMPLANT
SCREW CORTICAL 3.5MM  46MM (Screw) ×2 IMPLANT
SCREW CORTICAL 3.5MM 22MM (Screw) ×3 IMPLANT
SCREW CORTICAL 3.5MM 30MM (Screw) ×1 IMPLANT
SCREW CORTICAL 3.5MM 40MM (Screw) ×3 IMPLANT
SCREW CORTICAL 3.5MM 44MM (Screw) ×1 IMPLANT
SCREW CORTICAL 3.5MM 46MM (Screw) ×1 IMPLANT
SCREW N/L PLATE 3.5X42 (Screw) ×3 IMPLANT
SPONGE LAP 18X18 X RAY DECT (DISPOSABLE) ×3 IMPLANT
STAPLER VISISTAT 35W (STAPLE) IMPLANT
SUCTION FRAZIER HANDLE 10FR (MISCELLANEOUS) ×2
SUCTION TUBE FRAZIER 10FR DISP (MISCELLANEOUS) ×1 IMPLANT
SUT ETHILON 3 0 PS 1 (SUTURE) ×6 IMPLANT
SUT PDS AB 2-0 CT1 27 (SUTURE) IMPLANT
SUT VIC AB 2-0 CT1 27 (SUTURE) ×4
SUT VIC AB 2-0 CT1 TAPERPNT 27 (SUTURE) ×2 IMPLANT
TOWEL OR 17X24 6PK STRL BLUE (TOWEL DISPOSABLE) ×3 IMPLANT
TOWEL OR 17X26 10 PK STRL BLUE (TOWEL DISPOSABLE) ×6 IMPLANT
TUBE CONNECTING 12'X1/4 (SUCTIONS) ×1
TUBE CONNECTING 12X1/4 (SUCTIONS) ×2 IMPLANT
UNDERPAD 30X30 (UNDERPADS AND DIAPERS) ×3 IMPLANT
WATER STERILE IRR 1000ML POUR (IV SOLUTION) ×3 IMPLANT

## 2017-12-11 NOTE — Transfer of Care (Signed)
Immediate Anesthesia Transfer of Care Note  Patient: Jordan Cross  Procedure(s) Performed: OPEN REDUCTION INTERNAL FIXATION (ORIF) RIGHT ANKLE FRACTURE (Right )  Patient Location: PACU  Anesthesia Type:General  Level of Consciousness: awake, oriented and patient cooperative  Airway & Oxygen Therapy: Patient Spontanous Breathing and Patient connected to nasal cannula oxygen  Post-op Assessment: Report given to RN and Post -op Vital signs reviewed and stable  Post vital signs: Reviewed  Last Vitals:  Vitals Value Taken Time  BP 110/79 12/11/2017 11:58 AM  Temp    Pulse 82 12/11/2017 11:59 AM  Resp 12 12/11/2017 11:59 AM  SpO2 96 % 12/11/2017 11:59 AM  Vitals shown include unvalidated device data.  Last Pain:  Vitals:   12/11/17 0715  TempSrc:   PainSc: 0-No pain         Complications: No apparent anesthesia complications

## 2017-12-11 NOTE — Anesthesia Procedure Notes (Signed)
Anesthesia Regional Block: Adductor canal block   Pre-Anesthetic Checklist: ,, timeout performed, Correct Patient, Correct Site, Correct Laterality, Correct Procedure, Correct Position, site marked, Risks and benefits discussed,  Surgical consent,  Pre-op evaluation,  At surgeon's request and post-op pain management  Laterality: Right  Prep: chloraprep       Needles:  Injection technique: Single-shot  Needle Type: Echogenic Needle     Needle Length: 10cm  Needle Gauge: 21     Additional Needles:   Narrative:  Start time: 12/11/2017 7:02 AM End time: 12/11/2017 7:05 AM Injection made incrementally with aspirations every 5 mL.  Performed by: Personally  Anesthesiologist: Beryle LatheBrock, Thomas E, MD  Additional Notes: No pain on injection. No increased resistance to injection. Injection made in 5cc increments. Good needle visualization. Patient tolerated the procedure well.

## 2017-12-11 NOTE — Anesthesia Postprocedure Evaluation (Signed)
Anesthesia Post Note  Patient: Jordan MallJohn Cross  Procedure(s) Performed: OPEN REDUCTION INTERNAL FIXATION (ORIF) RIGHT ANKLE FRACTURE (Right )     Patient location during evaluation: PACU Anesthesia Type: General Level of consciousness: awake and alert Pain management: pain level controlled Vital Signs Assessment: post-procedure vital signs reviewed and stable Respiratory status: spontaneous breathing, nonlabored ventilation and respiratory function stable Cardiovascular status: blood pressure returned to baseline and stable Postop Assessment: no apparent nausea or vomiting Anesthetic complications: no    Last Vitals:  Vitals:   12/11/17 1230 12/11/17 1247  BP: 112/78 111/72  Pulse: 85 78  Resp: 15 16  Temp: 36.5 C   SpO2: 95% 95%    Last Pain:  Vitals:   12/11/17 1247  TempSrc:   PainSc: 0-No pain                 Beryle Lathehomas E Azilee Pirro

## 2017-12-11 NOTE — Brief Op Note (Signed)
12/11/2017  1:44 PM  PATIENT:  Jordan Cross  52 y.o. male  PRE-OPERATIVE DIAGNOSIS:  RIGHT BIMALLEOLAR ANKLE FRACTURE  POST-OPERATIVE DIAGNOSIS:   1. RIGHT ANKLE PILON FRACTURE 2. OSTEOCHONDRAL FRACTURE OF THE TALUS 3. STABLE SYNDESMOSIS  PROCEDURE:  Procedure(s): 1. OPEN REDUCTION INTERNAL FIXATION (ORIF) RIGHT ANKLE PILON FRACTURE, TIBIA ONLY  (Right) 2. OPEN TREATMENT OF OSTEOCHONDRAL FRACTURE OF THE TALUS WITH MICROFRACTURE. 3. STRESS FLUOROSCOPY OF RIGHT ANKLE SYNDESMOSIS  SURGEON:  Surgeon(s) and Role:    Myrene Galas* Chena Chohan, MD - Primary  PHYSICIAN ASSISTANT: Montez MoritaKEITH PAUL, PA-C  ANESTHESIA:   general  EBL:  100 mL   BLOOD ADMINISTERED:none  DRAINS: none   LOCAL MEDICATIONS USED:  NONE  SPECIMEN:  No Specimen  DISPOSITION OF SPECIMEN:  N/A  COUNTS:  YES  TOURNIQUET:  * No tourniquets in log *  DICTATIO: [id number]: [id number]  PLAN OF CARE: Discharge to home after PACU  PATIENT DISPOSITION:  PACU - hemodynamically stable.   Delay start of Pharmacological VTE agent (>24hrs) due to surgical blood loss or risk of bleeding: no

## 2017-12-11 NOTE — Anesthesia Procedure Notes (Signed)
Anesthesia Regional Block: Popliteal block   Pre-Anesthetic Checklist: ,, timeout performed, Correct Patient, Correct Site, Correct Laterality, Correct Procedure, Correct Position, site marked, Risks and benefits discussed,  Surgical consent,  Pre-op evaluation,  At surgeon's request and post-op pain management  Laterality: Right  Prep: chloraprep       Needles:  Injection technique: Single-shot  Needle Type: Echogenic Needle     Needle Length: 10cm  Needle Gauge: 21     Additional Needles:   Narrative:  Start time: 12/11/2017 7:06 AM End time: 12/11/2017 7:10 AM Injection made incrementally with aspirations every 5 mL.  Performed by: Personally  Anesthesiologist: Beryle LatheBrock, Cayne Yom E, MD  Additional Notes: No pain on injection. No increased resistance to injection. Injection made in 5cc increments. Good needle visualization. Patient tolerated the procedure well.

## 2017-12-11 NOTE — Discharge Instructions (Addendum)
[facility] Discharge Instructions   General Discharge Instructions  WEIGHT BEARING STATUS: Nonweightbearing Right leg   RANGE OF MOTION/ACTIVITY: ok to move toes, do not remove splint. Ok to move knee  Wound Care: do not remove splint/dressing, do not get splint/dressing wet   DVT/PE prophylaxis: aspirin 325 mg daily x 4 weeks   Diet: as you were eating previously.  Can use over the counter stool softeners and bowel preparations, such as Miralax, to help with bowel movements.  Narcotics can be constipating.  Be sure to drink plenty of fluids  PAIN MEDICATION USE AND EXPECTATIONS  You have likely been given narcotic medications to help control your pain.  After a traumatic event that results in an fracture (broken bone) with or without surgery, it is ok to use narcotic pain medications to help control one's pain.  We understand that everyone responds to pain differently and each individual patient will be evaluated on a regular basis for the continued need for narcotic medications. Ideally, narcotic medication use should last no more than 6-8 weeks (coinciding with fracture healing).   As a patient it is your responsibility as well to monitor narcotic medication use and report the amount and frequency you use these medications when you come to your office visit.   We would also advise that if you are using narcotic medications, you should take a dose prior to therapy to maximize you participation.  IF YOU ARE ON NARCOTIC MEDICATIONS IT IS NOT PERMISSIBLE TO OPERATE A MOTOR VEHICLE (MOTORCYCLE/CAR/TRUCK/MOPED) OR HEAVY MACHINERY DO NOT MIX NARCOTICS WITH OTHER CNS (CENTRAL NERVOUS SYSTEM) DEPRESSANTS SUCH AS ALCOHOL   STOP SMOKING OR USING NICOTINE PRODUCTS!!!!  As discussed nicotine severely impairs your body's ability to heal surgical and traumatic wounds but also impairs bone healing.  Wounds and bone heal by forming microscopic blood vessels (angiogenesis) and nicotine is a  vasoconstrictor (essentially, shrinks blood vessels).  Therefore, if vasoconstriction occurs to these microscopic blood vessels they essentially disappear and are unable to deliver necessary nutrients to the healing tissue.  This is one modifiable factor that you can do to dramatically increase your chances of healing your injury.    (This means no smoking, no nicotine gum, patches, etc)  DO NOT USE NONSTEROIDAL ANTI-INFLAMMATORY DRUGS (NSAID'S)  Using products such as Advil (ibuprofen), Aleve (naproxen), Motrin (ibuprofen) for additional pain control during fracture healing can delay and/or prevent the healing response.  If you would like to take over the counter (OTC) medication, Tylenol (acetaminophen) is ok.  However, some narcotic medications that are given for pain control contain acetaminophen as well. Therefore, you should not exceed more than 4000 mg of tylenol in a day if you do not have liver disease.  Also note that there are may OTC medicines, such as cold medicines and allergy medicines that my contain tylenol as well.  If you have any questions about medications and/or interactions please ask your doctor/PA or your pharmacist.      ICE AND ELEVATE INJURED/OPERATIVE EXTREMITY  Using ice and elevating the injured extremity above your heart can help with swelling and pain control.  Icing in a pulsatile fashion, such as 20 minutes on and 20 minutes off, can be followed.    Do not place ice directly on skin. Make sure there is a barrier between to skin and the ice pack.    Using frozen items such as frozen peas works well as the conform nicely to the are that needs to be iced.  USE AN ACE WRAP OR TED HOSE FOR SWELLING CONTROL  In addition to icing and elevation, Ace wraps or TED hose are used to help limit and resolve swelling.  It is recommended to use Ace wraps or TED hose until you are informed to stop.    When using Ace Wraps start the wrapping distally (farthest away from the body) and  wrap proximally (closer to the body)   Example: If you had surgery on your leg or thing and you do not have a splint on, start the ace wrap at the toes and work your way up to the thigh        If you had surgery on your upper extremity and do not have a splint on, start the ace wrap at your fingers and work your way up to the upper arm  IF YOU ARE IN A SPLINT OR CAST DO NOT REMOVE IT FOR ANY REASON   If your splint gets wet for any reason please contact the office immediately. You may shower in your splint or cast as long as you keep it dry.  This can be done by wrapping in a cast cover or garbage back (or similar)  Do Not stick any thing down your splint or cast such as pencils, money, or hangers to try and scratch yourself with.  If you feel itchy take benadryl as prescribed on the bottle for itching  IF YOU ARE IN A CAM BOOT (BLACK BOOT)  You may remove boot periodically. Perform daily dressing changes as noted below.  Wash the liner of the boot regularly and wear a sock when wearing the boot. It is recommended that you sleep in the boot until told otherwise  CALL THE OFFICE WITH ANY QUESTIONS OR CONCERNS: [phone]

## 2017-12-11 NOTE — Anesthesia Procedure Notes (Signed)
Procedure Name: LMA Insertion Date/Time: 12/11/2017 8:21 AM Performed by: Lovie Cholock, Turquoise Esch K, CRNA Pre-anesthesia Checklist: Patient identified, Emergency Drugs available, Suction available and Patient being monitored Patient Re-evaluated:Patient Re-evaluated prior to induction Oxygen Delivery Method: Circle System Utilized Preoxygenation: Pre-oxygenation with 100% oxygen Induction Type: IV induction Ventilation: Mask ventilation without difficulty LMA: LMA inserted LMA Size: 4.0 Number of attempts: 1 Airway Equipment and Method: Bite block Placement Confirmation: positive ETCO2 and breath sounds checked- equal and bilateral Tube secured with: Tape Dental Injury: Teeth and Oropharynx as per pre-operative assessment

## 2017-12-11 NOTE — H&P (Signed)
Cross&P documentation: 11/21/10 Right ankle fracture  -History and Physical Reviewed  -Patient has been re-examined  -No change in the plan of care  I discussed with the patient and his wife the risks and benefits of surgery, including the possibility of infection, nerve injury, vessel injury, wound breakdown, arthritis, symptomatic hardware, DVT/ PE, loss of motion, malunion, nonunion, and need for further surgery among others.  He acknowledged these risks and wished to proceed.   Jordan Cross

## 2017-12-13 ENCOUNTER — Encounter (HOSPITAL_COMMUNITY): Payer: Self-pay | Admitting: Orthopedic Surgery

## 2017-12-13 NOTE — Op Note (Signed)
NAMEZAVIYAR, RAHAL MEDICAL RECORD [id number] ACCOUNT [id number] DATE OF BIRTH:Sep 30, 1965 FACILITY: [facility] LOCATION: [facility] PHYSICIAN:Anelly Samarin H. Haytham Maher, MD  OPERATIVE REPORT  DATE OF PROCEDURE:  12/11/2017  PREOPERATIVE DIAGNOSIS:  Right ankle bimalleolar fracture.  POSTOPERATIVE DIAGNOSES: 1.  Right ankle pilon fracture. 2.  Stable syndesmosis. 3.  Osteochondral fracture of the talus  PROCEDURES: 1.  Open reduction internal fixation of right ankle pilon, tibia only. 2.  Open treatment of osteochondral fracture of the talus with microfracture. 3.  Stress fluoroscopy of the right ankle syndesmosis.  SURGEON:  Myrene Galas, MD  ASSISTANT:  Montez Morita, PA-C.  ANESTHESIA:  General.  COMPLICATIONS:  None.  TOURNIQUET:  None.  ESTIMATED BLOOD LOSS:  100 mL.  DISPOSITION:  To PACU.  CONDITION:  Stable.  BRIEF SUMMARY AND INDICATIONS FOR PROCEDURE:  The patient is a very pleasant 52 year old male who fell off a ladder sustaining a rather severe right ankle fracture with significant soft tissue swelling and ecchymosis.  He was placed into a splint with  ice and elevation to allow for adequate soft tissue swelling resolution such that we could proceed with surgery.  I discussed with him preoperatively the risks and benefits of surgery including the possibility of infection, nerve injury, vessel injury,  DVT, PE, heart attack, stroke, arthritis and need for further surgery including removal of symptomatic hardware and others.  After full discussion, he did wish to proceed.  SUMMARY OF PROCEDURE:  The patient received a regional block.  He was taken to the OR where general anesthesia was induced.  He did receive preoperative antibiotics.  His right lower extremity was prepped and draped in the usual sterile fashion.   Tourniquet was placed, but never used during the procedure.  After chlorhexidine wash, Betadine scrub and paint, a timeout was held and then a standard curvilinear  incision made to the medial malleolus.  The periosteum was left intact to the fracture  fragments which were encountered after carefully retracting the saphenous vein and nerve.  A 15 blade was used to tease back the periosteum at the edge only for about 2 mm.  The joint was then inspected and there was considerable chondral injury present.   This included complete full thickness loss of cartilage along the superior medial dome of the talus.  There were small free chondral fragments that were irrigated and removed from the joint, making certain that this was completely free of any debris.   Curettes and lavage were performed.  I then treated the osteochondral defect of the talus with microfracture technique using a small 1.25 mm K-wire placing approximately 10 poke holes through this considerably large area of eburnated bone from the  traumatic chondral injury.  After treatment of osteochondral fracture, further irrigation was performed while taking the ankle through a range of motion.  I then was able to interdigitate the fracture and hold it provisionally reduced with a pointed  tenaculum.  This was followed by placement of 2 additional K-wires.  Because of the magnitude of force on this fragment, I placed 2 cannulated screws that were 35 mm in length using the Paragon 28 ankle system.  This was followed by placement of a medial  hook plate, which was with time secured on the tip of the distal fragment and then a compression achieved proximally with 2 screws.  My assistant was critical for maintaining exposure during and protection of the saphenous vein also with holding the  reduction during provisional and definitive fixation.  Attention was then  turned to the lateral aspect of the tibia.  Here there appeared to be significant change from previous x-rays with impaction of the anterolateral aspect of the tibia.  I carefully  evaluated this under multiple fluoro views and recognized this to be a pilon type  fracture with significant proximal impaction of this corner of the tibial plafond.  Consequently, we made an additional 6 cm incision through which we were able to incise  the retinaculum, retract the superficial peroneal nerve anteriorly and then expose the fracture site and periosteum.  The periosteum was incised along this fracture line and the fracture booked open to allow for removal of hematoma with curettes.  There  was some fracture of the chondral surface, which was about 2 mm in length, completely unstable, delaminated and unreconstructable.  This was removed from this side.  Further irrigation of both the joint and the fracture fragments was performed.  I then  was able to pull this down into a reduced position.  Again, with interdigitation of the outer cortex pinning it provisionally with 2 K-wires and following with application of a buttress plate using the Ace DePuy fragment set.  I placed screws proximal to  the fracture site in an antiglide buttress fashion as well.  Wounds were irrigated and closed in standard layered fashion with a 2-0 Vicryl and 3-0 nylon.  Sterile gently compressive dressing was applied.  The patient awakened from anesthesia and  transported to PACU in stable condition.  Again, Montez Morita PA-C did assist me throughout and this included both the anterolateral tibial pilon component as well as medial malleolus avulsion.  It should be noted that following fixation of the tibia, the  C-arm was brought in and the ankle held in the mortise position while under live fluoroscopy, manual stress was applied to the ankle and external rotation with zero medial clear space widening or increase in the syndesmotic interval, thus indicating a  stable syndesmosis.    PROGNOSIS:  The patient will be nonweightbearing on the right lower extremity.  He will return to the office in 2 weeks for removal of his sutures and transition into a Cam boot.  Weightbearing will commence at 6 weeks and  gradual fashion up to 8 weeks.   He is at a certainly increased risk for arthritis given the magnitude of injury and chondral injury.  TN/NUANCE  D:12/12/2017 T:12/13/2017 JOB:[id number]

## 2018-07-15 ENCOUNTER — Other Ambulatory Visit: Payer: Self-pay

## 2018-07-15 DIAGNOSIS — I10 Essential (primary) hypertension: Secondary | ICD-10-CM

## 2018-07-15 MED ORDER — AMLODIPINE BESYLATE 10 MG PO TABS: 10.0000 mg | ORAL_TABLET | Freq: Every day | ORAL | 0 refills | Status: DC

## 2018-07-15 MED ORDER — LOSARTAN POTASSIUM 25 MG PO TABS: 25.0000 mg | ORAL_TABLET | Freq: Every morning | ORAL | 0 refills | Status: DC

## 2018-07-15 MED ORDER — HYDROCHLOROTHIAZIDE 25 MG PO TABS: 25.0000 mg | ORAL_TABLET | Freq: Every morning | ORAL | 0 refills | Status: DC

## 2018-07-15 NOTE — Telephone Encounter (Signed)
Spoke with pt need appt for further refills send 30 days med supply for  Hypertension and gave beth for appt

## 2018-08-08 ENCOUNTER — Other Ambulatory Visit: Payer: Self-pay

## 2018-08-08 ENCOUNTER — Encounter: Payer: Self-pay | Admitting: Adult Health

## 2018-08-08 ENCOUNTER — Ambulatory Visit: Payer: BC Managed Care – PPO | Admitting: Adult Health

## 2018-08-08 VITALS — Ht 71.0 in | Wt 215.0 lb

## 2018-08-08 DIAGNOSIS — I10 Essential (primary) hypertension: Secondary | ICD-10-CM

## 2018-08-08 DIAGNOSIS — E663 Overweight: Secondary | ICD-10-CM

## 2018-08-08 MED ORDER — LOSARTAN POTASSIUM 25 MG PO TABS: 25.0000 mg | ORAL_TABLET | Freq: Every morning | ORAL | 1 refills | Status: DC

## 2018-08-08 MED ORDER — AMLODIPINE BESYLATE 10 MG PO TABS: 10.0000 mg | ORAL_TABLET | Freq: Every day | ORAL | 1 refills | Status: DC

## 2018-08-08 MED ORDER — HYDROCHLOROTHIAZIDE 25 MG PO TABS: 25.0000 mg | ORAL_TABLET | Freq: Every morning | ORAL | 1 refills | Status: DC

## 2018-08-08 NOTE — Progress Notes (Signed)
[facility] [address] [place], [place] [address]  Internal MEDICINE  Telephone Visit  Patient Name: Jordan MallJohn Foulkes  [id number]  [id number]  Date of Service: 08/08/2018  I connected with the patient at 1000 by telephone and verified the patients identity using two identifiers.   I discussed the limitations, risks, security and privacy concerns of performing an evaluation and management service by telephone and the availability of in person appointments. I also discussed with the patient that there may be a patient responsible charge related to the service.  The patient expressed understanding and agrees to proceed.    Chief Complaint  Patient presents with  . Telephone Assessment  . Telephone Screen  . Hypertension  . Quality Metric Gaps    cologuard     HPI  Pt seen for follow up on HTN. Pt reports he has been doing well.  Denies Chest pain, Shortness of breath, palpitations, headache, or blurred vision. HE has been taking his medications with no difficulty.  He is in need of refills.      Current Medication: Outpatient Encounter Medications as of 08/08/2018  Medication Sig  . acetaminophen (TYLENOL) 500 MG tablet Take 1 tablet (500 mg total) by mouth every 8 (eight) hours.  Marland Kitchen. amLODipine (NORVASC) 10 MG tablet Take 1 tablet (10 mg total) by mouth daily.  . hydrochlorothiazide (HYDRODIURIL) 25 MG tablet Take 1 tablet (25 mg total) by mouth every morning.  Marland Kitchen. losartan (COZAAR) 25 MG tablet Take 1 tablet (25 mg total) by mouth every morning.  . Multiple Vitamin (MULTIVITAMIN WITH MINERALS) TABS tablet Take 2 tablets by mouth daily.  . [DISCONTINUED] amLODipine (NORVASC) 10 MG tablet Take 1 tablet (10 mg total) by mouth daily.  . [DISCONTINUED] hydrochlorothiazide (HYDRODIURIL) 25 MG tablet Take 1 tablet (25 mg total) by mouth every morning.  . [DISCONTINUED] losartan (COZAAR) 25 MG tablet Take 1 tablet (25 mg total) by mouth every morning.  Marland Kitchen. aspirin EC 325 MG tablet Take  1 tablet (325 mg total) by mouth daily. (Patient not taking: Reported on 08/08/2018)  . HYDROcodone-acetaminophen (NORCO) 7.5-325 MG tablet Take 1-2 tablets by mouth every 8 (eight) hours as needed for moderate pain. (Patient not taking: Reported on 08/08/2018)  . methocarbamol (ROBAXIN) 500 MG tablet Take 1 tablet (500 mg total) by mouth every 6 (six) hours as needed for muscle spasms. (Patient not taking: Reported on 08/08/2018)  . ondansetron (ZOFRAN ODT) 4 MG disintegrating tablet Take 1 tablet (4 mg total) by mouth every 8 (eight) hours as needed for nausea or vomiting. (Patient not taking: Reported on 08/08/2018)   No facility-administered encounter medications on file as of 08/08/2018.     Surgical History: Past Surgical History:  Procedure Laterality Date  . MULTIPLE TOOTH EXTRACTIONS    . ORIF ANKLE FRACTURE Right 12/11/2017   Procedure: OPEN REDUCTION INTERNAL FIXATION (ORIF) RIGHT ANKLE FRACTURE;  Surgeon: Myrene GalasHandy, Michael, MD;  [facility]: [facility] OR;  Service: Orthopedics;  Laterality: Right;    Medical History: Past Medical History:  Diagnosis Date  . Family history of adverse reaction to anesthesia    nausea  . Fracture    fracture  . Hypertension   . Pneumonia    as a child    Family History: Family History  Problem Relation Age of Onset  . Pancreatic cancer Father     Social History   Socioeconomic History  . Marital status: Married    Spouse name: Not on file  . Number of children: Not  on file  . Years of education: Not on file  . Highest education level: Not on file  Occupational History  . Not on file  Social Needs  . Financial resource strain: Not on file  . Food insecurity:    Worry: Not on file    Inability: Not on file  . Transportation needs:    Medical: Not on file    Non-medical: Not on file  Tobacco Use  . Smoking status: Never Smoker  . Smokeless tobacco: Never Used  . Tobacco comment:  " quit smoking cigarettes in my 20's"  Substance and Sexual  Activity  . Alcohol use: No    Frequency: Never  . Drug use: No  . Sexual activity: Not on file  Lifestyle  . Physical activity:    Days per week: Not on file    Minutes per session: Not on file  . Stress: Not on file  Relationships  . Social connections:    Talks on phone: Not on file    Gets together: Not on file    Attends religious service: Not on file    Active member of club or organization: Not on file    Attends meetings of clubs or organizations: Not on file    Relationship status: Not on file  . Intimate partner violence:    Fear of current or ex partner: Not on file    Emotionally abused: Not on file    Physically abused: Not on file    Forced sexual activity: Not on file  Other Topics Concern  . Not on file  Social History Narrative  . Not on file      Review of Systems  Constitutional: Negative.  Negative for chills, fatigue and unexpected weight change.  HENT: Negative.  Negative for congestion, rhinorrhea, sneezing and sore throat.   Eyes: Negative for redness.  Respiratory: Negative.  Negative for cough, chest tightness and shortness of breath.   Cardiovascular: Negative.  Negative for chest pain and palpitations.  Gastrointestinal: Negative.  Negative for abdominal pain, constipation, diarrhea, nausea and vomiting.  Endocrine: Negative.   Genitourinary: Negative.  Negative for dysuria and frequency.  Musculoskeletal: Negative.  Negative for arthralgias, back pain, joint swelling and neck pain.  Skin: Negative.  Negative for rash.  Allergic/Immunologic: Negative.   Neurological: Negative.  Negative for tremors and numbness.  Hematological: Negative for adenopathy. Does not bruise/bleed easily.  Psychiatric/Behavioral: Negative.  Negative for behavioral problems, sleep disturbance and suicidal ideas. The patient is not nervous/anxious.     Vital Signs: Ht 5\' 11"  (1.803 m)   Wt 215 lb (97.5 kg)   BMI 29.99 kg/m    Observation/Objective:  Speaking  in full sentences, conversing with ease.  NAD noted.   Assessment/Plan: 1. Essential hypertension Refilled patients BP meds.  Continue to take as prescribed.  BP stable at this time.  - losartan (COZAAR) 25 MG tablet; Take 1 tablet (25 mg total) by mouth every morning.  Dispense: 90 tablet; Refill: 1 - hydrochlorothiazide (HYDRODIURIL) 25 MG tablet; Take 1 tablet (25 mg total) by mouth every morning.  Dispense: 90 tablet; Refill: 1 - amLODipine (NORVASC) 10 MG tablet; Take 1 tablet (10 mg total) by mouth daily.  Dispense: 90 tablet; Refill: 1  2. Overweight Obesity Counseling: Risk Assessment: An assessment of behavioral risk factors was made today and includes lack of exercise sedentary lifestyle, lack of portion control and poor dietary habits.  Risk Modification Advice: She was counseled on portion control guidelines.  Restricting daily caloric intake to. . The detrimental long term effects of obesity on her health and ongoing poor compliance was also discussed with the patient.    General Counseling: Waldo verbalizes understanding of the findings of today's phone visit and agrees with plan of treatment. I have discussed any further diagnostic evaluation that may be needed or ordered today. We also reviewed his medications today. he has been encouraged to call the office with any questions or concerns that should arise related to todays visit.    No orders of the defined types were placed in this encounter.   Meds ordered this encounter  Medications  . losartan (COZAAR) 25 MG tablet    Sig: Take 1 tablet (25 mg total) by mouth every morning.    Dispense:  90 tablet    Refill:  1  . hydrochlorothiazide (HYDRODIURIL) 25 MG tablet    Sig: Take 1 tablet (25 mg total) by mouth every morning.    Dispense:  90 tablet    Refill:  1  . amLODipine (NORVASC) 10 MG tablet    Sig: Take 1 tablet (10 mg total) by mouth daily.    Dispense:  90 tablet    Refill:  1    Time spent:19  Minutes    Blima Ledger AGNP-C Internal medicine

## 2019-01-30 ENCOUNTER — Other Ambulatory Visit: Payer: Self-pay | Admitting: Adult Health

## 2019-01-30 DIAGNOSIS — I10 Essential (primary) hypertension: Secondary | ICD-10-CM

## 2019-02-06 ENCOUNTER — Telehealth: Payer: Self-pay

## 2019-02-06 NOTE — Telephone Encounter (Signed)
Confirmed appointment with patient. klh °

## 2019-02-10 ENCOUNTER — Ambulatory Visit: Payer: BLUE CROSS/BLUE SHIELD | Admitting: Adult Health

## 2019-02-11 ENCOUNTER — Telehealth: Payer: Self-pay

## 2019-02-11 NOTE — Telephone Encounter (Signed)
BILLED MISSED APPT FEE 02/10/19. BETH °

## 2019-02-24 ENCOUNTER — Telehealth: Payer: Self-pay

## 2019-02-24 NOTE — Telephone Encounter (Signed)
Confirmed appointment with patient. klh °

## 2019-02-26 ENCOUNTER — Encounter: Payer: Self-pay | Admitting: Adult Health

## 2019-02-26 ENCOUNTER — Other Ambulatory Visit: Payer: Self-pay

## 2019-02-26 ENCOUNTER — Ambulatory Visit: Payer: BC Managed Care – PPO | Admitting: Adult Health

## 2019-02-26 VITALS — BP 160/90 | HR 88 | Temp 98.1°F | Resp 16 | Ht 71.0 in | Wt 253.4 lb

## 2019-02-26 DIAGNOSIS — E663 Overweight: Secondary | ICD-10-CM | POA: Diagnosis not present

## 2019-02-26 DIAGNOSIS — I10 Essential (primary) hypertension: Secondary | ICD-10-CM

## 2019-02-26 DIAGNOSIS — Z1211 Encounter for screening for malignant neoplasm of colon: Secondary | ICD-10-CM | POA: Diagnosis not present

## 2019-02-26 MED ORDER — LOSARTAN POTASSIUM 50 MG PO TABS: 50.0000 mg | ORAL_TABLET | Freq: Every morning | ORAL | 1 refills | Status: DC

## 2019-02-26 NOTE — Progress Notes (Signed)
[facility] [place], [place] [address]  Internal MEDICINE  Office Visit Note  Patient Name: Jordan Cross  [id number]  [id number]  Date of Service: 03/01/2019  Chief Complaint  Patient presents with  . Hypertension    HPI Pt is here for follow up.  Pt has a history of HTN.  He has been doing      Current Medication: Outpatient Encounter Medications as of 02/26/2019  Medication Sig  . amLODipine (NORVASC) 10 MG tablet TAKE 1 TABLET BY MOUTH ONCE DAILY  . hydrochlorothiazide (HYDRODIURIL) 25 MG tablet TAKE 1 TABLET BY MOUTH EVERY MORNING  . losartan (COZAAR) 50 MG tablet Take 1 tablet (50 mg total) by mouth every morning.  . [DISCONTINUED] acetaminophen (TYLENOL) 500 MG tablet Take 1 tablet (500 mg total) by mouth every 8 (eight) hours.  . [DISCONTINUED] losartan (COZAAR) 25 MG tablet TAKE 1 TABLET BY MOUTH EVERY MORNING  . [DISCONTINUED] Multiple Vitamin (MULTIVITAMIN WITH MINERALS) TABS tablet Take 2 tablets by mouth daily.  . [DISCONTINUED] ondansetron (ZOFRAN ODT) 4 MG disintegrating tablet Take 1 tablet (4 mg total) by mouth every 8 (eight) hours as needed for nausea or vomiting.  . [DISCONTINUED] aspirin EC 325 MG tablet Take 1 tablet (325 mg total) by mouth daily. (Patient not taking: Reported on 02/26/2019)  . [DISCONTINUED] HYDROcodone-acetaminophen (NORCO) 7.5-325 MG tablet Take 1-2 tablets by mouth every 8 (eight) hours as needed for moderate pain. (Patient not taking: Reported on 02/26/2019)  . [DISCONTINUED] methocarbamol (ROBAXIN) 500 MG tablet Take 1 tablet (500 mg total) by mouth every 6 (six) hours as needed for muscle spasms. (Patient not taking: Reported on 02/26/2019)   No facility-administered encounter medications on file as of 02/26/2019.    Surgical History: Past Surgical History:  Procedure Laterality Date  . MULTIPLE TOOTH EXTRACTIONS    . ORIF ANKLE FRACTURE Right 12/11/2017   Procedure: OPEN REDUCTION INTERNAL FIXATION (ORIF) RIGHT  ANKLE FRACTURE;  Surgeon: Altamese Ector, MD;  Location: [place];  Service: Orthopedics;  Laterality: Right;    Medical History: Past Medical History:  Diagnosis Date  . Family history of adverse reaction to anesthesia    nausea  . Fracture    fracture  . Hypertension   . Pneumonia    as a child    Family History: Family History  Problem Relation Age of Onset  . Pancreatic cancer Father     Social History   Socioeconomic History  . Marital status: Married    Spouse name: Not on file  . Number of children: Not on file  . Years of education: Not on file  . Highest education level: Not on file  Occupational History  . Not on file  Tobacco Use  . Smoking status: Never Smoker  . Smokeless tobacco: Never Used  . Tobacco comment:  " quit smoking cigarettes in my 20's"  Substance and Sexual Activity  . Alcohol use: No  . Drug use: No  . Sexual activity: Not on file  Other Topics Concern  . Not on file  Social History Narrative  . Not on file   Social Determinants of Health   Financial Resource Strain:   . Difficulty of Paying Living Expenses: Not on file  Food Insecurity:   . Worried About Charity fundraiser in the Last Year: Not on file  . Ran Out of Food in the Last Year: Not on file  Transportation Needs:   . Lack of Transportation (Medical): Not on file  .  Lack of Transportation (Non-Medical): Not on file  Physical Activity:   . Days of Exercise per Week: Not on file  . Minutes of Exercise per Session: Not on file  Stress:   . Feeling of Stress : Not on file  Social Connections:   . Frequency of Communication with Friends and Family: Not on file  . Frequency of Social Gatherings with Friends and Family: Not on file  . Attends Religious Services: Not on file  . Active Member of Clubs or Organizations: Not on file  . Attends Archivist Meetings: Not on file  . Marital Status: Not on file  Intimate Partner Violence:   . Fear of Current or  Ex-Partner: Not on file  . Emotionally Abused: Not on file  . Physically Abused: Not on file  . Sexually Abused: Not on file      Review of Systems  Constitutional: Negative.  Negative for chills, fatigue and unexpected weight change.  HENT: Negative.  Negative for congestion, rhinorrhea, sneezing and sore throat.   Eyes: Negative for redness.  Respiratory: Negative.  Negative for cough, chest tightness and shortness of breath.   Cardiovascular: Negative.  Negative for chest pain and palpitations.  Gastrointestinal: Negative.  Negative for abdominal pain, constipation, diarrhea, nausea and vomiting.  Endocrine: Negative.   Genitourinary: Negative.  Negative for dysuria and frequency.  Musculoskeletal: Negative.  Negative for arthralgias, back pain, joint swelling and neck pain.  Skin: Negative.  Negative for rash.  Allergic/Immunologic: Negative.   Neurological: Negative.  Negative for tremors and numbness.  Hematological: Negative for adenopathy. Does not bruise/bleed easily.  Psychiatric/Behavioral: Negative.  Negative for behavioral problems, sleep disturbance and suicidal ideas. The patient is not nervous/anxious.     Vital Signs: BP (!) 160/90   Pulse 88   Temp 98.1 F (36.7 C)   Resp 16   Ht _0  (1.803 m)   Wt 253 lb 6.4 oz (114.9 kg)   SpO2 99%   BMI 35.34 kg/m    Physical Exam Vitals signs and nursing note reviewed.  Constitutional:      General: He is not in acute distress.    Appearance: He is well-developed. He is not diaphoretic.  HENT:     Head: Normocephalic and atraumatic.     Mouth/Throat:     Pharynx: No oropharyngeal exudate.  Eyes:     Pupils: Pupils are equal, round, and reactive to light.  Neck:     Musculoskeletal: Normal range of motion and neck supple.     Thyroid: No thyromegaly.     Vascular: No JVD.     Trachea: No tracheal deviation.  Cardiovascular:     Rate and Rhythm: Normal rate and regular rhythm.     Heart sounds: Normal  heart sounds. No murmur. No friction rub. No gallop.   Pulmonary:     Effort: Pulmonary effort is normal. No respiratory distress.     Breath sounds: Normal breath sounds. No wheezing or rales.  Chest:     Chest wall: No tenderness.  Abdominal:     Palpations: Abdomen is soft.     Tenderness: There is no abdominal tenderness. There is no guarding.  Musculoskeletal: Normal range of motion.  Lymphadenopathy:     Cervical: No cervical adenopathy.  Skin:    General: Skin is warm and dry.  Neurological:     Mental Status: He is alert and oriented to person, place, and time.     Cranial Nerves: No cranial nerve  deficit.  Psychiatric:        Behavior: Behavior normal.        Thought Content: Thought content normal.        Judgment: Judgment normal.    Assessment/Plan:  1. Essential hypertension Elevated at this time. Pt has gained 38 pound since last visit.  Will increase cozaar to 74m daily, and have patient follow up in 4 weeks.    - losartan (COZAAR) 50 MG tablet; Take 1 tablet (50 mg total) by mouth every morning.  Dispense: 30 tablet; Refill: 1  2. Overweight Obesity Counseling: Risk Assessment: An assessment of behavioral risk factors was made today and includes lack of exercise sedentary lifestyle, lack of portion control and poor dietary habits.  Risk Modification Advice: She was counseled on portion control guidelines. Restricting daily caloric intake to. . The detrimental long term effects of obesity on her health and ongoing poor compliance was also discussed with the patient.  3. Screen for colon cancer PT has Cologuard kit at home, he reports he will do it.  General Counseling: Horace verbalizes understanding of the findings of todays visit and agrees with plan of treatment. I have discussed any further diagnostic evaluation that may be needed or ordered today. We also reviewed his medications today. he has been encouraged to call the office with any questions or concerns  that should arise related to todays visit.    No orders of the defined types were placed in this encounter.   Meds ordered this encounter  Medications  . losartan (COZAAR) 50 MG tablet    Sig: Take 1 tablet (50 mg total) by mouth every morning.    Dispense:  30 tablet    Refill:  1    Time spent: 15 Minutes   This patient was seen by AOrson GearAGNP-C in Collaboration with Dr FLavera Guiseas a part of collaborative care agreement     AKendell BaneAGNP-C Internal medicine

## 2019-03-10 ENCOUNTER — Telehealth: Payer: Self-pay

## 2019-03-10 NOTE — Telephone Encounter (Signed)
Patient called to let us know tested positive for covid, does not want to schedule appointment at this time will call back if patient would like to be seen. klh  

## 2019-03-24 ENCOUNTER — Telehealth: Payer: Self-pay

## 2019-03-24 NOTE — Telephone Encounter (Signed)
Confirmed appointment with patient. klh °

## 2019-03-26 ENCOUNTER — Ambulatory Visit (INDEPENDENT_AMBULATORY_CARE_PROVIDER_SITE_OTHER): Payer: BC Managed Care – PPO | Admitting: Adult Health

## 2019-03-26 ENCOUNTER — Encounter: Payer: Self-pay | Admitting: Adult Health

## 2019-03-26 ENCOUNTER — Other Ambulatory Visit: Payer: Self-pay

## 2019-03-26 VITALS — BP 158/102 | HR 82 | Temp 97.5°F | Resp 16 | Ht 71.0 in | Wt 246.2 lb

## 2019-03-26 DIAGNOSIS — I10 Essential (primary) hypertension: Secondary | ICD-10-CM | POA: Diagnosis not present

## 2019-03-26 DIAGNOSIS — E782 Mixed hyperlipidemia: Secondary | ICD-10-CM

## 2019-03-26 DIAGNOSIS — E663 Overweight: Secondary | ICD-10-CM | POA: Diagnosis not present

## 2019-03-26 NOTE — Progress Notes (Signed)
[facility] [address] [place], [place] [address]  Internal MEDICINE  Office Visit Note  Patient Name: Jordan Cross  [id number]  [id number]  Date of Service: 03/26/2019  Chief Complaint  Patient presents with  . Hypertension    patient still take 25mg  lostarn    HPI Pt is here for follow up on HTN. He reports since his last visit he had Covid-infection.  He and his wife.  He has recovered nicely, although still has difficulty tasting and smelling some things.  He has also lost 7 pounds since our last visit.  He has been trying to lose weight to help his bp.  He did not increase his losartan to 50mg  as discussed at last visit, because he wanted to see if losing weight would help.  His bp is elevated again today 158/102 initially.  Denies Chest pain, Shortness of breath, palpitations, headache, or blurred vision.    Current Medication: Outpatient Encounter Medications as of 03/26/2019  Medication Sig  . amLODipine (NORVASC) 10 MG tablet TAKE 1 TABLET BY MOUTH ONCE DAILY  . hydrochlorothiazide (HYDRODIURIL) 25 MG tablet TAKE 1 TABLET BY MOUTH EVERY MORNING  . losartan (COZAAR) 50 MG tablet Take 1 tablet (50 mg total) by mouth every morning.   No facility-administered encounter medications on file as of 03/26/2019.    Surgical History: Past Surgical History:  Procedure Laterality Date  . MULTIPLE TOOTH EXTRACTIONS    . ORIF ANKLE FRACTURE Right 12/11/2017   Procedure: OPEN REDUCTION INTERNAL FIXATION (ORIF) RIGHT ANKLE FRACTURE;  Surgeon: 05/24/2019, MD;  Location: [facility] OR;  Service: Orthopedics;  Laterality: Right;    Medical History: Past Medical History:  Diagnosis Date  . Family history of adverse reaction to anesthesia    nausea  . Fracture    fracture  . Hypertension   . Pneumonia    as a child    Family History: Family History  Problem Relation Age of Onset  . Pancreatic cancer Father     Social History   Socioeconomic History  . Marital status:  Married    Spouse name: Not on file  . Number of children: Not on file  . Years of education: Not on file  . Highest education level: Not on file  Occupational History  . Not on file  Tobacco Use  . Smoking status: Never Smoker  . Smokeless tobacco: Never Used  . Tobacco comment:  " quit smoking cigarettes in my 20's"  Substance and Sexual Activity  . Alcohol use: Yes    Comment: occ.  . Drug use: Yes    Types: Marijuana  . Sexual activity: Not on file  Other Topics Concern  . Not on file  Social History Narrative  . Not on file   Social Determinants of Health   Financial Resource Strain:   . Difficulty of Paying Living Expenses: Not on file  Food Insecurity:   . Worried About Myrene Galas in the Last Year: Not on file  . Ran Out of Food in the Last Year: Not on file  Transportation Needs:   . Lack of Transportation (Medical): Not on file  . Lack of Transportation (Non-Medical): Not on file  Physical Activity:   . Days of Exercise per Week: Not on file  . Minutes of Exercise per Session: Not on file  Stress:   . Feeling of Stress : Not on file  Social Connections:   . Frequency of Communication with Friends and Family: Not  on file  . Frequency of Social Gatherings with Friends and Family: Not on file  . Attends Religious Services: Not on file  . Active Member of Clubs or Organizations: Not on file  . Attends Banker Meetings: Not on file  . Marital Status: Not on file  Intimate Partner Violence:   . Fear of Current or Ex-Partner: Not on file  . Emotionally Abused: Not on file  . Physically Abused: Not on file  . Sexually Abused: Not on file      Review of Systems  Constitutional: Negative.  Negative for chills, fatigue and unexpected weight change.  HENT: Negative.  Negative for congestion, rhinorrhea, sneezing and sore throat.   Eyes: Negative for redness.  Respiratory: Negative.  Negative for cough, chest tightness and shortness of  breath.   Cardiovascular: Negative.  Negative for chest pain and palpitations.  Gastrointestinal: Negative.  Negative for abdominal pain, constipation, diarrhea, nausea and vomiting.  Endocrine: Negative.   Genitourinary: Negative.  Negative for dysuria and frequency.  Musculoskeletal: Negative.  Negative for arthralgias, back pain, joint swelling and neck pain.  Skin: Negative.  Negative for rash.  Allergic/Immunologic: Negative.   Neurological: Negative.  Negative for tremors and numbness.  Hematological: Negative for adenopathy. Does not bruise/bleed easily.  Psychiatric/Behavioral: Negative.  Negative for behavioral problems, sleep disturbance and suicidal ideas. The patient is not nervous/anxious.     Vital Signs: BP (!) 158/102   Pulse 82   Temp (!) 97.5 F (36.4 C)   Resp 16   Ht 5\' 11"  (1.803 m)   Wt 246 lb 3.2 oz (111.7 kg)   SpO2 97%   BMI 34.34 kg/m    Physical Exam Vitals and nursing note reviewed.  Constitutional:      General: He is not in acute distress.    Appearance: He is well-developed. He is not diaphoretic.  HENT:     Head: Normocephalic and atraumatic.     Mouth/Throat:     Pharynx: No oropharyngeal exudate.  Eyes:     Pupils: Pupils are equal, round, and reactive to light.  Neck:     Thyroid: No thyromegaly.     Vascular: No JVD.     Trachea: No tracheal deviation.  Cardiovascular:     Rate and Rhythm: Normal rate and regular rhythm.     Heart sounds: Normal heart sounds. No murmur. No friction rub. No gallop.   Pulmonary:     Effort: Pulmonary effort is normal. No respiratory distress.     Breath sounds: Normal breath sounds. No wheezing or rales.  Chest:     Chest wall: No tenderness.  Abdominal:     Palpations: Abdomen is soft.     Tenderness: There is no abdominal tenderness. There is no guarding.  Musculoskeletal:        General: Normal range of motion.     Cervical back: Normal range of motion and neck supple.  Lymphadenopathy:      Cervical: No cervical adenopathy.  Skin:    General: Skin is warm and dry.  Neurological:     Mental Status: He is alert and oriented to person, place, and time.     Cranial Nerves: No cranial nerve deficit.  Psychiatric:        Behavior: Behavior normal.        Thought Content: Thought content normal.        Judgment: Judgment normal.    Assessment/Plan: 1. Essential hypertension Advised patient to increase losartan to  50mg  as discussed, have baseline labs drawn (paper slip given) and follow up in 4 weeks.  2. Overweight Obesity Counseling: Risk Assessment: An assessment of behavioral risk factors was made today and includes lack of exercise sedentary lifestyle, lack of portion control and poor dietary habits.  Risk Modification Advice: She was counseled on portion control guidelines. Restricting daily caloric intake to 1800. The detrimental long term effects of obesity on her health and ongoing poor compliance was also discussed with the patient.  3. Mixed hyperlipidemia Check lipid panel.  General Counseling: Jordan Cross verbalizes understanding of the findings of todays visit and agrees with plan of treatment. I have discussed any further diagnostic evaluation that may be needed or ordered today. We also reviewed his medications today. he has been encouraged to call the office with any questions or concerns that should arise related to todays visit.    No orders of the defined types were placed in this encounter.   No orders of the defined types were placed in this encounter.   Time spent: 25 Minutes   This patient was seen by Orson Gear AGNP-C in Collaboration with Dr Lavera Guise as a part of collaborative care agreement     Kendell Bane AGNP-C Internal medicine

## 2019-04-29 ENCOUNTER — Telehealth: Payer: Self-pay

## 2019-04-29 ENCOUNTER — Other Ambulatory Visit: Payer: Self-pay

## 2019-04-29 DIAGNOSIS — I10 Essential (primary) hypertension: Secondary | ICD-10-CM

## 2019-04-29 MED ORDER — LOSARTAN POTASSIUM 50 MG PO TABS: 50.0000 mg | ORAL_TABLET | Freq: Every morning | ORAL | 0 refills | Status: DC

## 2019-04-29 NOTE — Telephone Encounter (Signed)
PATIENT WAS ORIGINALLY SCHEDULED FOR Thursday FOR CPE BUT IS OUT OF TOWN WORKING AND WAS UNABLE TO KEEP THIS APPT. FIRST AVAILABILITY FOR CPE WAS FIRST WEEK IN [facility]. PATIENT WILL NOT HAVE ENOUGH LOSARTAN FOR BP TO LAST CAN WE SEND ENOUGH UNTIL PATIENTS NEXT APPT? USES [facility].

## 2019-05-01 ENCOUNTER — Encounter: Payer: BC Managed Care – PPO | Admitting: Nurse Practitioner

## 2019-05-07 IMAGING — CR DG TIBIA/FIBULA 2V*R*
4 series · 4 of 4 positions shown · non-contrast
Comparison: None.

CLINICAL DATA: Pt c/o generalized right ankle pain and swelling and
distal lower right leg pain and swelling after falling off a 7 foot
ladder today. Hx of prior right ankle fracture.

EXAM:
RIGHT TIBIA AND FIBULA - 2 VIEW

[tibia ap (1 of 2)]
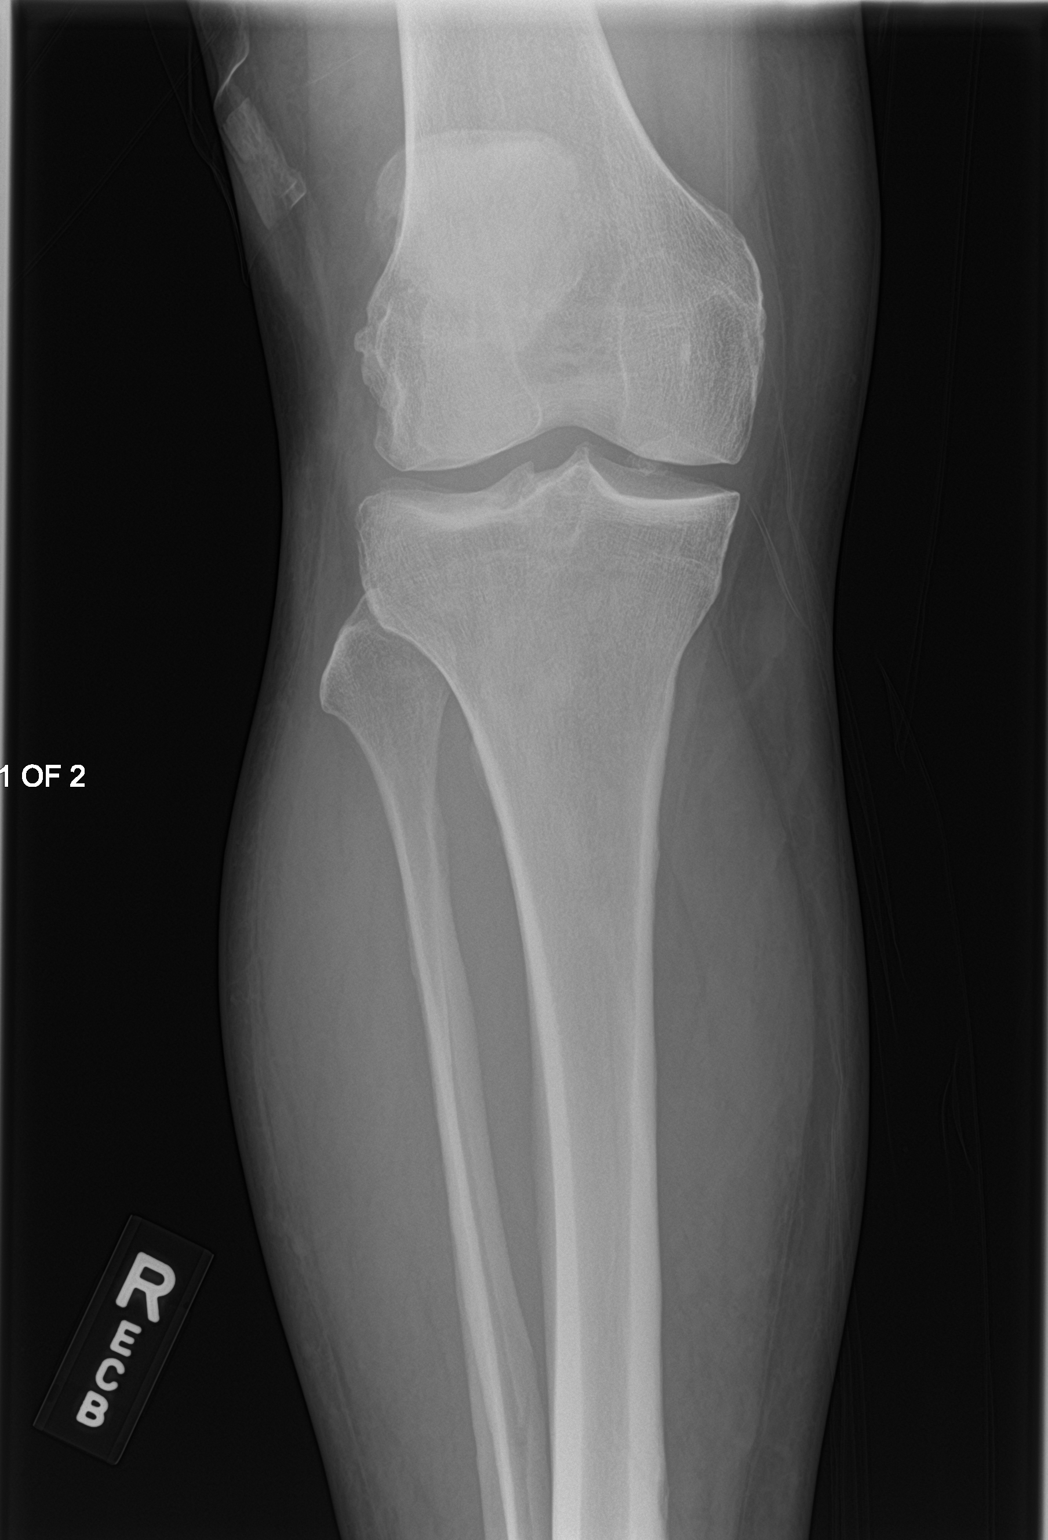

[tibia ap (2 of 2)]
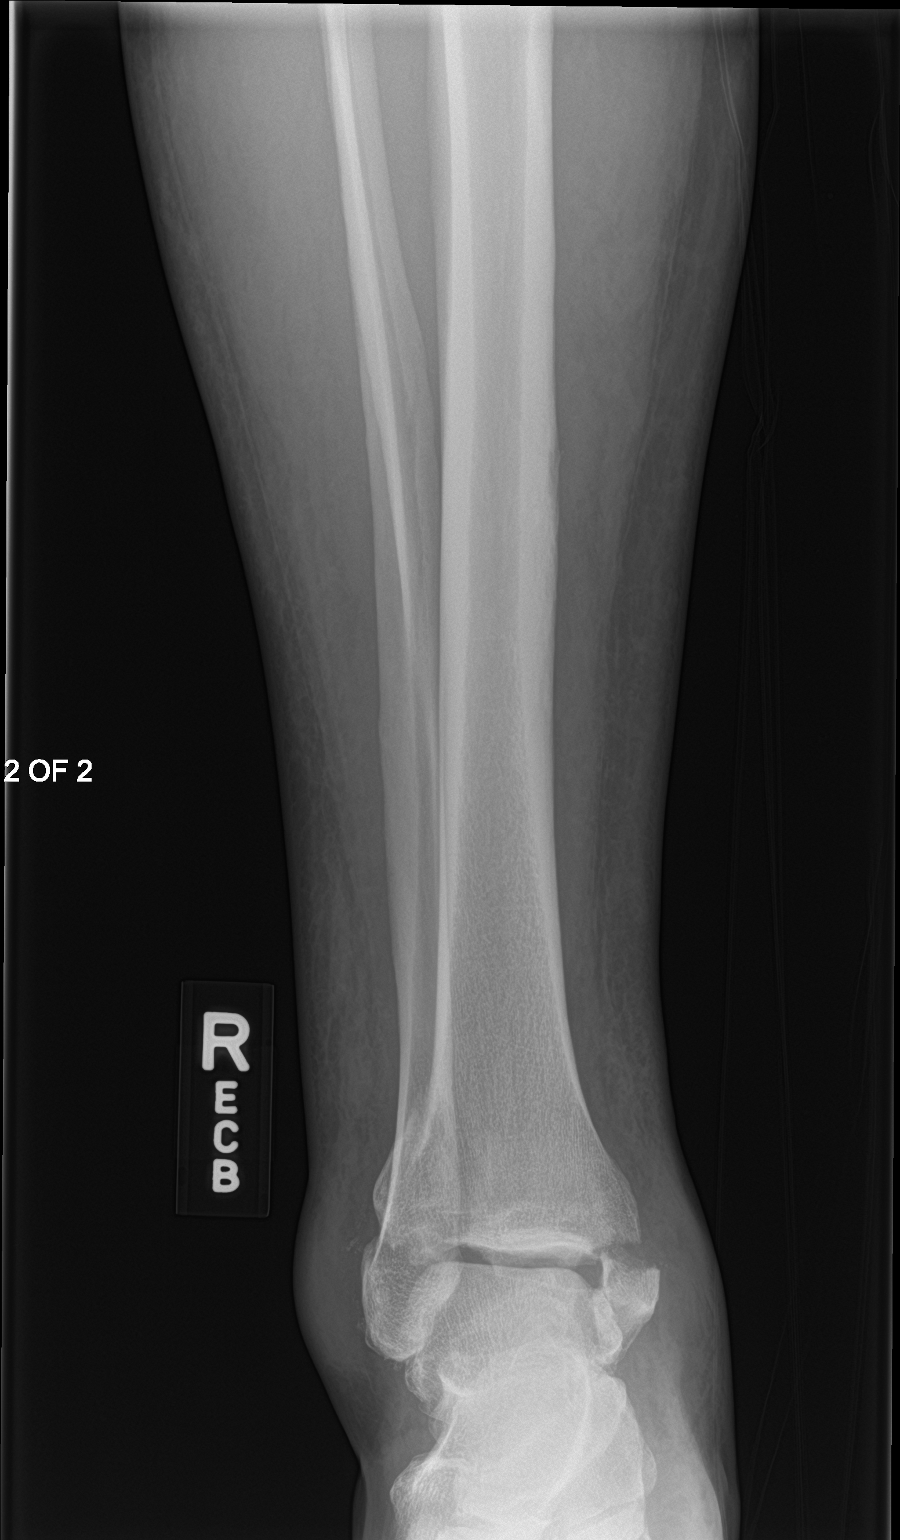

[tibia lat (1 of 2)]
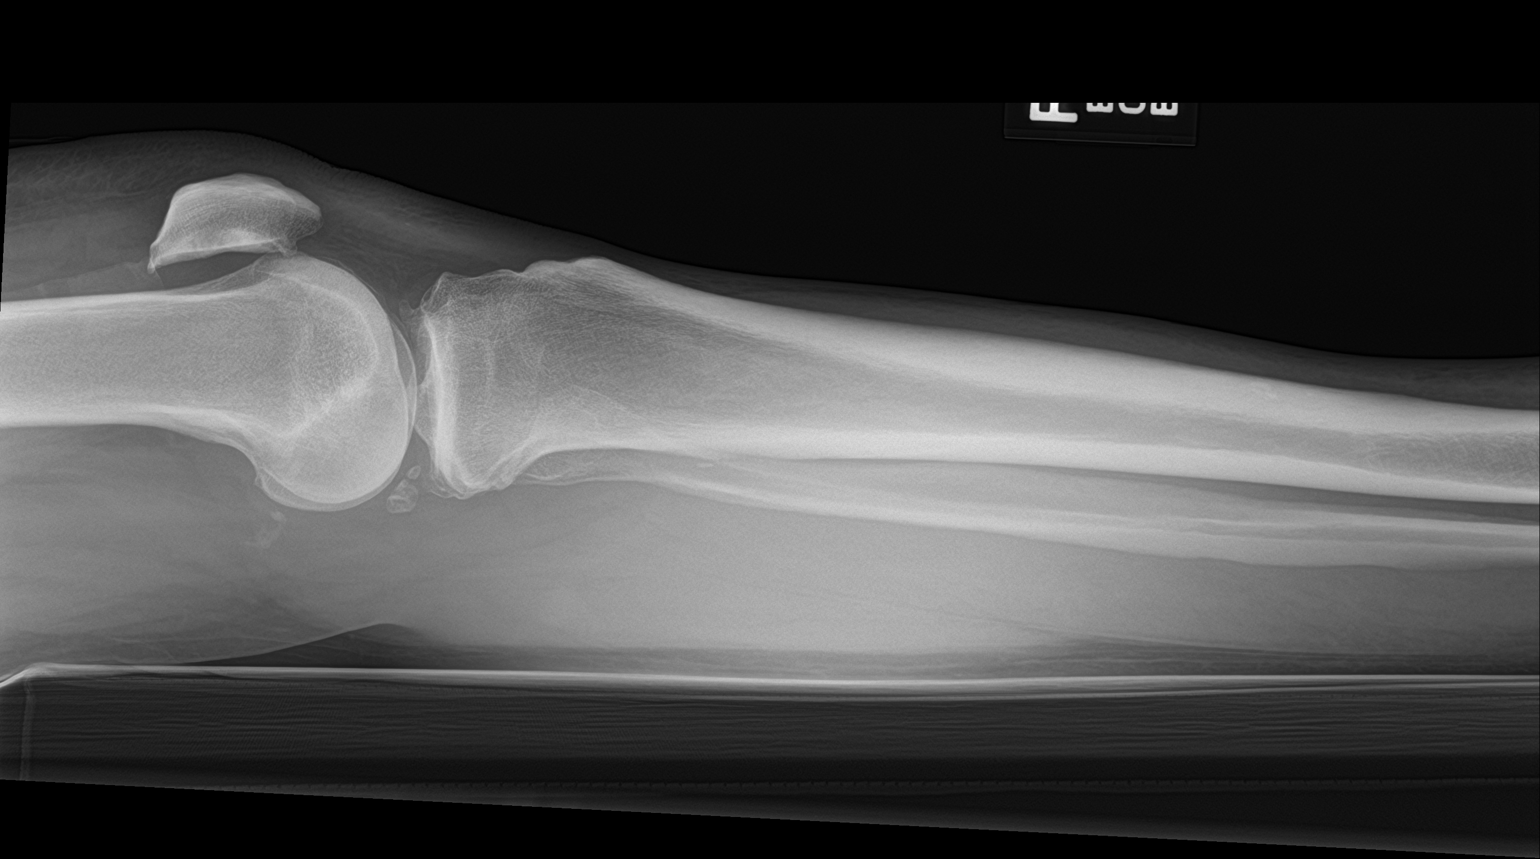

[tibia lat (2 of 2)]
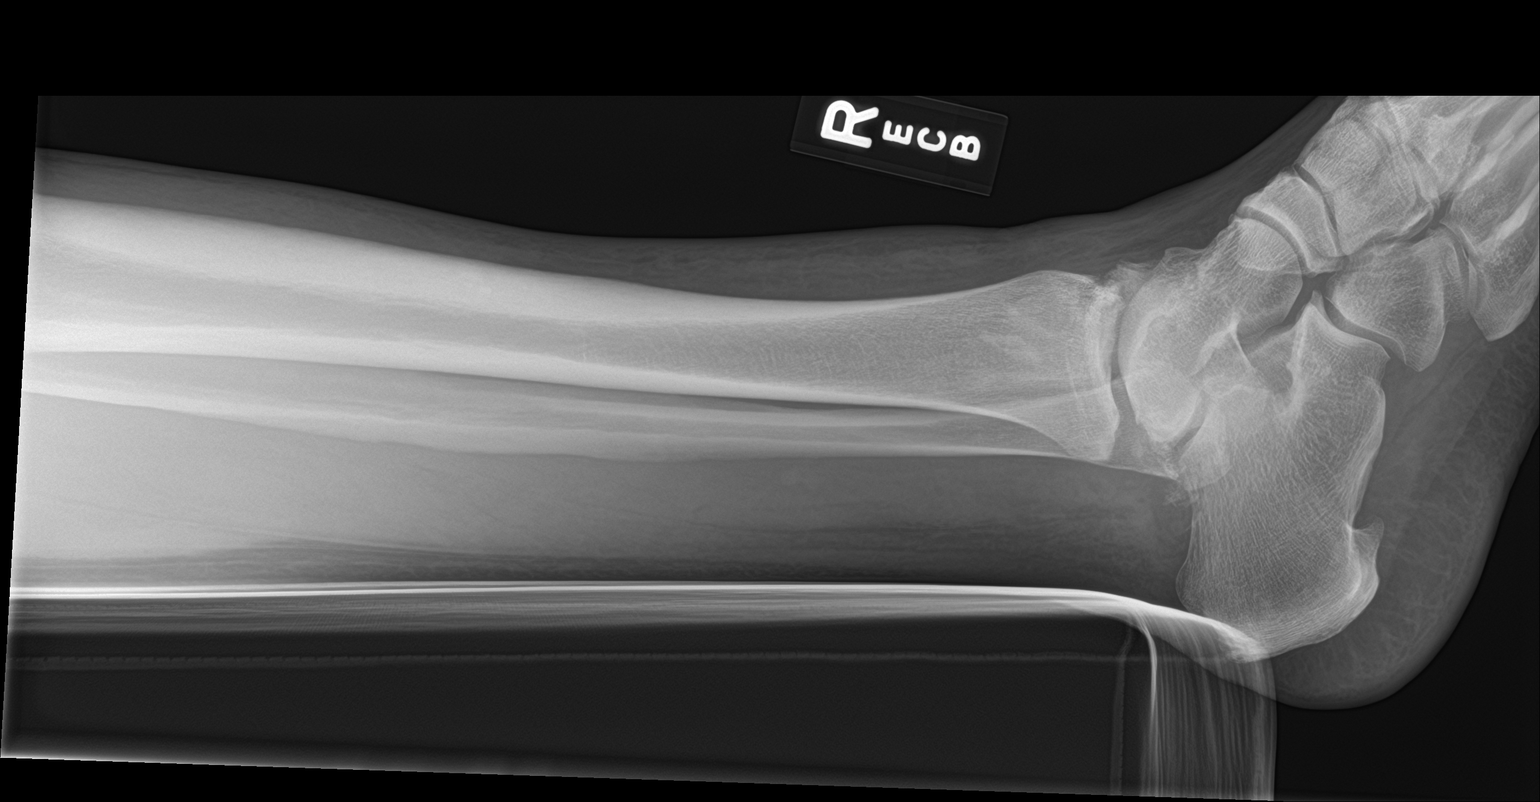

[4 of 4 positions shown; findings below may reference images not displayed]

FINDINGS: Avulsion fracture of the medial malleolus, major fracture fragment
distracted approximately 6 mm.

Fracture of the lateral malleolus below the level of the tibiotalar
articulation, fragment displaced approximately 5 mm.

Lateral and medial soft tissue swelling.

Posterior malleolus appears intact. The ankle mortise appears intact
on these nonstress views. Small calcaneal spurs.Small marginal spurs
from the patellar articular surface. Proximal tibia and fibula
intact.
IMPRESSION: 1. Distracted medial malleolus fracture.
2. Minimally displaced fracture of the distal lateral malleolus.

## 2019-05-21 ENCOUNTER — Telehealth: Payer: Self-pay

## 2019-05-23 ENCOUNTER — Encounter: Payer: BC Managed Care – PPO | Admitting: Nurse Practitioner

## 2019-06-10 ENCOUNTER — Other Ambulatory Visit: Payer: Self-pay

## 2019-06-10 DIAGNOSIS — I10 Essential (primary) hypertension: Secondary | ICD-10-CM

## 2019-06-10 MED ORDER — LOSARTAN POTASSIUM 50 MG PO TABS: 50.0000 mg | ORAL_TABLET | Freq: Every morning | ORAL | 0 refills | Status: DC

## 2019-06-17 ENCOUNTER — Telehealth: Payer: Self-pay

## 2019-06-17 NOTE — Telephone Encounter (Signed)
Confirmed and screened for 06-19-19 ov. 

## 2019-06-19 ENCOUNTER — Other Ambulatory Visit: Payer: Self-pay

## 2019-06-19 ENCOUNTER — Encounter: Payer: Self-pay | Admitting: Nurse Practitioner

## 2019-06-19 ENCOUNTER — Ambulatory Visit: Payer: Self-pay | Admitting: Nurse Practitioner

## 2019-06-19 VITALS — BP 138/78 | HR 89 | Temp 98.0°F | Resp 16 | Ht 71.0 in | Wt 240.8 lb

## 2019-06-19 DIAGNOSIS — E782 Mixed hyperlipidemia: Secondary | ICD-10-CM

## 2019-06-19 DIAGNOSIS — I10 Essential (primary) hypertension: Secondary | ICD-10-CM

## 2019-06-19 MED ORDER — AMLODIPINE BESYLATE 10 MG PO TABS: 10.0000 mg | ORAL_TABLET | Freq: Every day | ORAL | 1 refills | Status: DC

## 2019-06-19 MED ORDER — LOSARTAN POTASSIUM 50 MG PO TABS: 50.0000 mg | ORAL_TABLET | Freq: Every morning | ORAL | 1 refills | Status: DC

## 2019-06-19 MED ORDER — HYDROCHLOROTHIAZIDE 25 MG PO TABS: 25.0000 mg | ORAL_TABLET | Freq: Every morning | ORAL | 1 refills | Status: DC

## 2019-06-19 NOTE — Progress Notes (Signed)
[facility] [address] [place], [place] [address]  Internal MEDICINE  Office Visit Note  Patient Name: Jordan Cross  [id number]  [id number]  Date of Service: 06/19/2019  Chief Complaint  Patient presents with  . Hypertension    The patient is here for routine follow up. Blood pressure is mildly elevated. Losartan was recently increased. Has been doing well with higher dosing. He states that he did just come from work, and that may be causing his blood pressure to be slightly elevated. He denies chest pain, chest pressure, or shortness of breath. Denies headaches. He would like to hold off on preventive health testing for now as he currently has no health insurance.       Current Medication: Outpatient Encounter Medications as of 06/19/2019  Medication Sig  . amLODipine (NORVASC) 10 MG tablet Take 1 tablet (10 mg total) by mouth daily.  . hydrochlorothiazide (HYDRODIURIL) 25 MG tablet Take 1 tablet (25 mg total) by mouth every morning.  Marland Kitchen losartan (COZAAR) 50 MG tablet Take 1 tablet (50 mg total) by mouth every morning.  . [DISCONTINUED] amLODipine (NORVASC) 10 MG tablet TAKE 1 TABLET BY MOUTH ONCE DAILY  . [DISCONTINUED] hydrochlorothiazide (HYDRODIURIL) 25 MG tablet TAKE 1 TABLET BY MOUTH EVERY MORNING  . [DISCONTINUED] losartan (COZAAR) 50 MG tablet Take 1 tablet (50 mg total) by mouth every morning.   No facility-administered encounter medications on file as of 06/19/2019.    Surgical History: Past Surgical History:  Procedure Laterality Date  . MULTIPLE TOOTH EXTRACTIONS    . ORIF ANKLE FRACTURE Right 12/11/2017   Procedure: OPEN REDUCTION INTERNAL FIXATION (ORIF) RIGHT ANKLE FRACTURE;  Surgeon: Myrene Galas, MD;  Location: [facility] OR;  Service: Orthopedics;  Laterality: Right;    Medical History: Past Medical History:  Diagnosis Date  . Family history of adverse reaction to anesthesia    nausea  . Fracture    fracture  . Hypertension   . Pneumonia    as a  child    Family History: Family History  Problem Relation Age of Onset  . Pancreatic cancer Father     Social History   Socioeconomic History  . Marital status: Married    Spouse name: Not on file  . Number of children: Not on file  . Years of education: Not on file  . Highest education level: Not on file  Occupational History  . Not on file  Tobacco Use  . Smoking status: Former Smoker    Types: Cigarettes  . Smokeless tobacco: Never Used  . Tobacco comment:  " quit smoking cigarettes in my 20's"  Substance and Sexual Activity  . Alcohol use: Yes    Comment: occ.  . Drug use: Yes    Types: Marijuana  . Sexual activity: Not on file  Other Topics Concern  . Not on file  Social History Narrative  . Not on file   Social Determinants of Health   Financial Resource Strain:   . Difficulty of Paying Living Expenses:   Food Insecurity:   . Worried About Programme researcher, broadcasting/film/video in the Last Year:   . Barista in the Last Year:   Transportation Needs:   . Freight forwarder (Medical):   Marland Kitchen Lack of Transportation (Non-Medical):   Physical Activity:   . Days of Exercise per Week:   . Minutes of Exercise per Session:   Stress:   . Feeling of Stress :   Social Connections:   .  Frequency of Communication with Friends and Family:   . Frequency of Social Gatherings with Friends and Family:   . Attends Religious Services:   . Active Member of Clubs or Organizations:   . Attends Banker Meetings:   Marland Kitchen Marital Status:   Intimate Partner Violence:   . Fear of Current or Ex-Partner:   . Emotionally Abused:   Marland Kitchen Physically Abused:   . Sexually Abused:       Review of Systems  Constitutional: Negative for activity change, chills, fatigue and unexpected weight change.  HENT: Negative for congestion, postnasal drip, rhinorrhea, sneezing and sore throat.   Respiratory: Negative for cough, chest tightness, shortness of breath and wheezing.   Cardiovascular:  Negative for chest pain and palpitations.  Gastrointestinal: Negative for abdominal pain, constipation, diarrhea, nausea and vomiting.  Endocrine: Negative for cold intolerance, heat intolerance, polydipsia and polyuria.  Genitourinary: Negative for dysuria, frequency and urgency.  Musculoskeletal: Negative for arthralgias, back pain, joint swelling and neck pain.  Skin: Negative for rash.  Allergic/Immunologic: Negative for environmental allergies.  Neurological: Negative for dizziness, tremors, numbness and headaches.  Hematological: Negative for adenopathy. Does not bruise/bleed easily.  Psychiatric/Behavioral: Negative for behavioral problems (Depression), sleep disturbance and suicidal ideas. The patient is not nervous/anxious.     Today's Vitals   06/19/19 1458  BP: 138/78  Pulse: 89  Resp: 16  Temp: 98 F (36.7 C)  SpO2: 98%  Weight: 240 lb 12.8 oz (109.2 kg)  Height: 5\' 11"  (1.803 m)   Body mass index is 33.58 kg/m.  Physical Exam Vitals and nursing note reviewed.  Constitutional:      General: He is not in acute distress.    Appearance: Normal appearance. He is well-developed. He is not diaphoretic.  HENT:     Head: Normocephalic and atraumatic.     Nose: Nose normal.     Mouth/Throat:     Pharynx: No oropharyngeal exudate.  Eyes:     Pupils: Pupils are equal, round, and reactive to light.  Neck:     Thyroid: No thyromegaly.     Vascular: No carotid bruit or JVD.     Trachea: No tracheal deviation.  Cardiovascular:     Rate and Rhythm: Normal rate and regular rhythm.     Heart sounds: Normal heart sounds. No murmur. No friction rub. No gallop.   Pulmonary:     Effort: Pulmonary effort is normal. No respiratory distress.     Breath sounds: Normal breath sounds. No wheezing or rales.  Chest:     Chest wall: No tenderness.  Abdominal:     Palpations: Abdomen is soft.  Musculoskeletal:        General: Normal range of motion.     Cervical back: Normal range  of motion and neck supple.  Lymphadenopathy:     Cervical: No cervical adenopathy.  Skin:    General: Skin is warm and dry.  Neurological:     Mental Status: He is alert and oriented to person, place, and time.     Cranial Nerves: No cranial nerve deficit.  Psychiatric:        Mood and Affect: Mood normal.        Behavior: Behavior normal.        Thought Content: Thought content normal.        Judgment: Judgment normal.    Assessment/Plan: 1. Essential hypertension Blood pressure stable. Continue all blood pressure medication as prescribed. Ninety day prescriptions sent for all today.  -  amLODipine (NORVASC) 10 MG tablet; Take 1 tablet (10 mg total) by mouth daily.  Dispense: 90 tablet; Refill: 1 - hydrochlorothiazide (HYDRODIURIL) 25 MG tablet; Take 1 tablet (25 mg total) by mouth every morning.  Dispense: 90 tablet; Refill: 1 - losartan (COZAAR) 50 MG tablet; Take 1 tablet (50 mg total) by mouth every morning.  Dispense: 90 tablet; Refill: 1  2. Mixed hyperlipidemia Control through diet and exercise. Will check lipid panel when able.   General Counseling: Abram verbalizes understanding of the findings of todays visit and agrees with plan of treatment. I have discussed any further diagnostic evaluation that may be needed or ordered today. We also reviewed his medications today. he has been encouraged to call the office with any questions or concerns that should arise related to todays visit.  Hypertension Counseling:   The following hypertensive lifestyle modification were recommended and discussed:  1. Limiting alcohol intake to less than 1 oz/day of ethanol:(24 oz of beer or 8 oz of wine or 2 oz of 100-proof whiskey). 2. Take baby ASA 81 mg daily. 3. Importance of regular aerobic exercise and losing weight. 4. Reduce dietary saturated fat and cholesterol intake for overall cardiovascular health. 5. Maintaining adequate dietary potassium, calcium, and magnesium intake. 6. Regular  monitoring of the blood pressure. 7. Reduce sodium intake to less than 100 mmol/day (less than 2.3 gm of sodium or less than 6 gm of sodium choride)   This patient was seen by Virgie with Dr Lavera Guise as a part of collaborative care agreement  Meds ordered this encounter  Medications  . amLODipine (NORVASC) 10 MG tablet    Sig: Take 1 tablet (10 mg total) by mouth daily.    Dispense:  90 tablet    Refill:  1    Order Specific Question:   Supervising Provider    Answer:   Lavera Guise [[id number]]  . hydrochlorothiazide (HYDRODIURIL) 25 MG tablet    Sig: Take 1 tablet (25 mg total) by mouth every morning.    Dispense:  90 tablet    Refill:  1    Order Specific Question:   Supervising Provider    Answer:   Lavera Guise [[id number]]  . losartan (COZAAR) 50 MG tablet    Sig: Take 1 tablet (50 mg total) by mouth every morning.    Dispense:  90 tablet    Refill:  1    Order Specific Question:   Supervising Provider    Answer:   Lavera Guise [[id number]]    Total time spent: 20 Minutes   Time spent includes review of chart, medications, test results, and follow up plan with the patient.      Dr Lavera Guise Internal medicine

## 2019-06-24 NOTE — Telephone Encounter (Signed)
ERROR

## 2019-12-19 ENCOUNTER — Ambulatory Visit: Payer: Self-pay | Admitting: Nurse Practitioner

## 2019-12-29 ENCOUNTER — Other Ambulatory Visit: Payer: Self-pay

## 2019-12-29 DIAGNOSIS — I10 Essential (primary) hypertension: Secondary | ICD-10-CM

## 2019-12-29 MED ORDER — AMLODIPINE BESYLATE 10 MG PO TABS: 10.0000 mg | ORAL_TABLET | Freq: Every day | ORAL | 0 refills | Status: DC

## 2019-12-29 MED ORDER — LOSARTAN POTASSIUM 50 MG PO TABS: 50.0000 mg | ORAL_TABLET | Freq: Every morning | ORAL | 0 refills | Status: DC

## 2019-12-29 MED ORDER — HYDROCHLOROTHIAZIDE 25 MG PO TABS: 25.0000 mg | ORAL_TABLET | Freq: Every morning | ORAL | 0 refills | Status: DC

## 2020-01-01 ENCOUNTER — Telehealth: Payer: Self-pay

## 2020-01-01 NOTE — Telephone Encounter (Signed)
Called PT to schedule appt within 30 days. Pt stated he would have to get back to [place] on when he could come in. Pt has been advised if we dont see him in 30 days we cant refill meds.

## 2020-01-13 ENCOUNTER — Ambulatory Visit: Payer: Self-pay | Admitting: Internal Medicine

## 2020-01-14 ENCOUNTER — Encounter: Payer: Self-pay | Admitting: Internal Medicine

## 2020-01-14 ENCOUNTER — Ambulatory Visit: Payer: BLUE CROSS/BLUE SHIELD | Admitting: Internal Medicine

## 2020-01-14 ENCOUNTER — Other Ambulatory Visit: Payer: Self-pay

## 2020-01-14 DIAGNOSIS — Z0189 Encounter for other specified special examinations: Secondary | ICD-10-CM | POA: Diagnosis not present

## 2020-01-14 DIAGNOSIS — Z125 Encounter for screening for malignant neoplasm of prostate: Secondary | ICD-10-CM | POA: Diagnosis not present

## 2020-01-14 DIAGNOSIS — E782 Mixed hyperlipidemia: Secondary | ICD-10-CM

## 2020-01-14 DIAGNOSIS — Z79899 Other long term (current) drug therapy: Secondary | ICD-10-CM

## 2020-01-14 DIAGNOSIS — I10 Essential (primary) hypertension: Secondary | ICD-10-CM

## 2020-01-14 NOTE — Progress Notes (Signed)
[facility] [address] [place], [place] [address]  Internal MEDICINE  Office Visit Note  Patient Name: Jordan Cross  [id number]  [id number]  Date of Service: 01/22/2020  Chief Complaint  Patient presents with  . Follow-up    refill request  . Hypertension  . policy update form    reviewed    HPI Pt is here for routine follow up. Blood pressure continues to be elevated, have not had any labs done, no lipid profile or PSA on file. Denies any fever or chills. No c/p or sob   Current Medication: Outpatient Encounter Medications as of 01/14/2020  Medication Sig  . amLODipine (NORVASC) 10 MG tablet Take 1 tablet (10 mg total) by mouth daily.  . hydrochlorothiazide (HYDRODIURIL) 25 MG tablet Take 1 tablet (25 mg total) by mouth every morning.  Marland Kitchen losartan (COZAAR) 50 MG tablet Take 1 tablet (50 mg total) by mouth every morning.  . [DISCONTINUED] amLODipine (NORVASC) 10 MG tablet Take 1 tablet (10 mg total) by mouth daily.  . [DISCONTINUED] amLODipine (NORVASC) 10 MG tablet Take 1 tablet (10 mg total) by mouth daily.  . [DISCONTINUED] hydrochlorothiazide (HYDRODIURIL) 25 MG tablet Take 1 tablet (25 mg total) by mouth every morning.  . [DISCONTINUED] hydrochlorothiazide (HYDRODIURIL) 25 MG tablet Take 1 tablet (25 mg total) by mouth every morning.  . [DISCONTINUED] losartan (COZAAR) 50 MG tablet Take 1 tablet (50 mg total) by mouth every morning.   No facility-administered encounter medications on file as of 01/14/2020.    Surgical History: Past Surgical History:  Procedure Laterality Date  . MULTIPLE TOOTH EXTRACTIONS    . ORIF ANKLE FRACTURE Right 12/11/2017   Procedure: OPEN REDUCTION INTERNAL FIXATION (ORIF) RIGHT ANKLE FRACTURE;  Surgeon: Myrene Galas, MD;  Location: [facility] OR;  Service: Orthopedics;  Laterality: Right;    Medical History: Past Medical History:  Diagnosis Date  . Family history of adverse reaction to anesthesia    nausea  . Fracture    fracture   . Hypertension   . Pneumonia    as a child    Family History: Family History  Problem Relation Age of Onset  . Pancreatic cancer Father     Social History   Socioeconomic History  . Marital status: Married    Spouse name: Not on file  . Number of children: Not on file  . Years of education: Not on file  . Highest education level: Not on file  Occupational History  . Not on file  Tobacco Use  . Smoking status: Former Smoker    Types: Cigarettes  . Smokeless tobacco: Never Used  . Tobacco comment:  " quit smoking cigarettes in my 20's"  Vaping Use  . Vaping Use: Never used  Substance and Sexual Activity  . Alcohol use: Yes    Comment: occ.  . Drug use: Yes    Types: Marijuana  . Sexual activity: Not on file  Other Topics Concern  . Not on file  Social History Narrative  . Not on file   Social Determinants of Health   Financial Resource Strain:   . Difficulty of Paying Living Expenses: Not on file  Food Insecurity:   . Worried About Programme researcher, broadcasting/film/video in the Last Year: Not on file  . Ran Out of Food in the Last Year: Not on file  Transportation Needs:   . Lack of Transportation (Medical): Not on file  . Lack of Transportation (Non-Medical): Not on file  Physical Activity:   .  Days of Exercise per Week: Not on file  . Minutes of Exercise per Session: Not on file  Stress:   . Feeling of Stress : Not on file  Social Connections:   . Frequency of Communication with Friends and Family: Not on file  . Frequency of Social Gatherings with Friends and Family: Not on file  . Attends Religious Services: Not on file  . Active Member of Clubs or Organizations: Not on file  . Attends Banker Meetings: Not on file  . Marital Status: Not on file  Intimate Partner Violence:   . Fear of Current or Ex-Partner: Not on file  . Emotionally Abused: Not on file  . Physically Abused: Not on file  . Sexually Abused: Not on file      Review of Systems   Constitutional: Negative for chills, fatigue and unexpected weight change.  HENT: Positive for postnasal drip. Negative for congestion, rhinorrhea, sneezing and sore throat.   Eyes: Negative for redness.  Respiratory: Negative for cough, chest tightness and shortness of breath.   Cardiovascular: Negative for chest pain and palpitations.  Gastrointestinal: Negative for abdominal pain, constipation, diarrhea, nausea and vomiting.  Genitourinary: Negative for dysuria and frequency.  Musculoskeletal: Negative for arthralgias, back pain, joint swelling and neck pain.  Skin: Negative for rash.  Neurological: Negative.  Negative for tremors and numbness.  Hematological: Negative for adenopathy. Does not bruise/bleed easily.  Psychiatric/Behavioral: Negative for behavioral problems (Depression), sleep disturbance and suicidal ideas. The patient is not nervous/anxious.     Vital Signs: BP (!) 140/100   Pulse 84   Temp 98.1 F (36.7 C)   Resp 16   Ht 5\' 10"  (1.778 m)   Wt 232 lb (105.2 kg)   SpO2 98%   BMI 33.29 kg/m    Physical Exam Constitutional:      General: He is not in acute distress.    Appearance: He is well-developed. He is not diaphoretic.  HENT:     Head: Normocephalic and atraumatic.     Mouth/Throat:     Pharynx: No oropharyngeal exudate.  Eyes:     Pupils: Pupils are equal, round, and reactive to light.  Neck:     Thyroid: No thyromegaly.     Vascular: No JVD.     Trachea: No tracheal deviation.  Cardiovascular:     Rate and Rhythm: Normal rate and regular rhythm.     Heart sounds: Normal heart sounds. No murmur heard.  No friction rub. No gallop.   Pulmonary:     Effort: Pulmonary effort is normal. No respiratory distress.     Breath sounds: No wheezing or rales.  Chest:     Chest wall: No tenderness.  Abdominal:     General: Bowel sounds are normal.     Palpations: Abdomen is soft.  Musculoskeletal:        General: Normal range of motion.     Cervical  back: Normal range of motion and neck supple.  Lymphadenopathy:     Cervical: No cervical adenopathy.  Skin:    General: Skin is warm and dry.  Neurological:     Mental Status: He is alert and oriented to person, place, and time.     Cranial Nerves: No cranial nerve deficit.  Psychiatric:        Behavior: Behavior normal.        Thought Content: Thought content normal.        Judgment: Judgment normal.    Assessment/Plan: 1.  Essential hypertension Will continue Norvasc, losartan and hctz, monitor blood pressure   2. Encounter for blood test - CBC with Differential/Platelet - Lipid Panel With LDL/HDL Ratio - TSH - T4, free - Comprehensive metabolic panel - Urinalysis  3. Mixed hyperlipidemia - Lipid Panel With LDL/HDL Ratio  4. Special screening for malignant neoplasm of prostate PSA   5. Encounter for long-term (current) use of high-risk medication - CBC with Differential/Platelet - Lipid Panel With LDL/HDL Ratio - TSH - T4, free - Comprehensive metabolic panel - Urinalysis  General Counseling: Heman verbalizes understanding of the findings of todays visit and agrees with plan of treatment. I have discussed any further diagnostic evaluation that may be needed or ordered today. We also reviewed his medications today. he has been encouraged to call the office with any questions or concerns that should arise related to todays visit.   Orders Placed This Encounter  Procedures  . CBC with Differential/Platelet  . Lipid Panel With LDL/HDL Ratio  . TSH  . T4, free  . Comprehensive metabolic panel  . Urinalysis  . PSA    Meds ordered this encounter  Medications  . losartan (COZAAR) 50 MG tablet    Sig: Take 1 tablet (50 mg total) by mouth every morning.    Dispense:  90 tablet    Refill:  0    Pt need appt for refills  . DISCONTD: hydrochlorothiazide (HYDRODIURIL) 25 MG tablet    Sig: Take 1 tablet (25 mg total) by mouth every morning.    Dispense:  90 tablet     Refill:  0    Pt need appt for refills  . DISCONTD: amLODipine (NORVASC) 10 MG tablet    Sig: Take 1 tablet (10 mg total) by mouth daily.    Dispense:  30 tablet    Refill:  0    Pt need appt for refills  . amLODipine (NORVASC) 10 MG tablet    Sig: Take 1 tablet (10 mg total) by mouth daily.    Dispense:  90 tablet    Refill:  0    Pt need appt for refills  . hydrochlorothiazide (HYDRODIURIL) 25 MG tablet    Sig: Take 1 tablet (25 mg total) by mouth every morning.    Dispense:  90 tablet    Refill:  0    Pt need appt for refills    Total time spent:30 Minutes Time spent includes review of chart, medications, test results, and follow up plan with the patient.    Dr Lyndon Code Internal medicine

## 2020-01-22 ENCOUNTER — Telehealth: Payer: Self-pay

## 2020-01-22 MED ORDER — HYDROCHLOROTHIAZIDE 25 MG PO TABS: 25.0000 mg | ORAL_TABLET | Freq: Every morning | ORAL | 0 refills | Status: DC

## 2020-01-22 MED ORDER — AMLODIPINE BESYLATE 10 MG PO TABS: 10.0000 mg | ORAL_TABLET | Freq: Every day | ORAL | 0 refills | Status: DC

## 2020-01-22 MED ORDER — LOSARTAN POTASSIUM 50 MG PO TABS: 50.0000 mg | ORAL_TABLET | Freq: Every morning | ORAL | 0 refills | Status: DC

## 2020-01-22 NOTE — Telephone Encounter (Signed)
-----   Message from Lyndon Code, MD sent at 01/22/2020  1:03 PM EDT ----- Call pt see how is his blood pressure Takes 3 bp meds ( did I increase any ?? )

## 2020-01-22 NOTE — Telephone Encounter (Signed)
Pt is not monitoring his BP at home. Pt knows that losartan had been increased at some point but does not remember it being increased the last time he was seen. Informed pt that DFK was going to keep it at 50mg .

## 2020-02-27 ENCOUNTER — Encounter: Payer: BLUE CROSS/BLUE SHIELD | Admitting: Nurse Practitioner

## 2020-04-02 ENCOUNTER — Encounter: Payer: BLUE CROSS/BLUE SHIELD | Admitting: Nurse Practitioner

## 2020-04-15 ENCOUNTER — Other Ambulatory Visit: Payer: Self-pay | Admitting: Internal Medicine

## 2020-04-15 DIAGNOSIS — I10 Essential (primary) hypertension: Secondary | ICD-10-CM

## 2020-04-15 NOTE — Telephone Encounter (Signed)
Pt needs a annual physical app for his refills with TH

## 2020-04-15 NOTE — Telephone Encounter (Signed)
Pt need appt for refills  ?

## 2020-04-19 ENCOUNTER — Telehealth: Payer: Self-pay

## 2020-04-19 NOTE — Telephone Encounter (Signed)
Unable to leave voice message to schedule CPE with Jordan Cross

## 2020-04-28 ENCOUNTER — Telehealth: Payer: Self-pay

## 2020-04-28 NOTE — Telephone Encounter (Signed)
Spoke to pt, he will call back to schedule CPE

## 2020-07-02 ENCOUNTER — Other Ambulatory Visit: Payer: Self-pay | Admitting: Internal Medicine

## 2020-07-02 DIAGNOSIS — I10 Essential (primary) hypertension: Secondary | ICD-10-CM

## 2020-07-05 ENCOUNTER — Encounter: Payer: Self-pay | Admitting: Hospice and Palliative Medicine

## 2020-07-05 ENCOUNTER — Other Ambulatory Visit: Payer: Self-pay

## 2020-07-05 ENCOUNTER — Ambulatory Visit: Payer: 59 | Admitting: Hospice and Palliative Medicine

## 2020-07-05 VITALS — BP 160/94 | HR 83 | Temp 98.9°F | Resp 16 | Ht 70.0 in | Wt 233.0 lb

## 2020-07-05 DIAGNOSIS — I10 Essential (primary) hypertension: Secondary | ICD-10-CM

## 2020-07-05 DIAGNOSIS — Z1211 Encounter for screening for malignant neoplasm of colon: Secondary | ICD-10-CM | POA: Diagnosis not present

## 2020-07-05 DIAGNOSIS — Z0189 Encounter for other specified special examinations: Secondary | ICD-10-CM | POA: Diagnosis not present

## 2020-07-05 MED ORDER — AMLODIPINE BESYLATE 10 MG PO TABS: ORAL_TABLET | ORAL | 3 refills | Status: DC

## 2020-07-05 MED ORDER — LOSARTAN POTASSIUM 50 MG PO TABS: ORAL_TABLET | ORAL | 3 refills | Status: DC

## 2020-07-05 MED ORDER — HYDROCHLOROTHIAZIDE 25 MG PO TABS: ORAL_TABLET | ORAL | 3 refills | Status: DC

## 2020-07-05 NOTE — Progress Notes (Signed)
[facility] [address] [place], [place] [address]  Internal MEDICINE  Office Visit Note  Patient Name: Jordan Cross  [id number]  [id number]  Date of Service: 07/12/2020  Chief Complaint  Patient presents with  . Hypertension  . Quality Metric Gaps    colonoscopy    HPI Patient is here for routine follow-up Has been without BP medications for several days, here for refill requests Denies chest pain, headaches, shortness of breath or visual disturbances Currently without insurance--unable to afford frequent follow-up appointments and labs at this time  Was set up for cologuard testing but never went through with testing due to financial reasons   Current Medication: Outpatient Encounter Medications as of 07/05/2020  Medication Sig  . [DISCONTINUED] amLODipine (NORVASC) 10 MG tablet TAKE 1 TABLET BY MOUTH ONCE A DAY. PATIENT NEEDS AN APPOINTMENT FOR REFILLS  . [DISCONTINUED] hydrochlorothiazide (HYDRODIURIL) 25 MG tablet TAKE 1 TABLET BY MOUTH EVERY MORNING. PATIENT NEEDS APPOINTMENT FOR REFILLS  . [DISCONTINUED] losartan (COZAAR) 50 MG tablet TAKE 1 TABLET BY MOUTH EVERY MORNING. PER MD PATIENT NEEDS TO SCHEDULE AN APPTFOR REFILLS  . amLODipine (NORVASC) 10 MG tablet TAKE 1 TABLET BY MOUTH ONCE A DAY.  . hydrochlorothiazide (HYDRODIURIL) 25 MG tablet TAKE 1 TABLET BY MOUTH EVERY MORNING.  Marland Kitchen losartan (COZAAR) 50 MG tablet TAKE 1 TABLET BY MOUTH EVERY MORNING.   No facility-administered encounter medications on file as of 07/05/2020.    Surgical History: Past Surgical History:  Procedure Laterality Date  . MULTIPLE TOOTH EXTRACTIONS    . ORIF ANKLE FRACTURE Right 12/11/2017   Procedure: OPEN REDUCTION INTERNAL FIXATION (ORIF) RIGHT ANKLE FRACTURE;  Surgeon: Myrene Galas, MD;  Location: [facility] OR;  Service: Orthopedics;  Laterality: Right;    Medical History: Past Medical History:  Diagnosis Date  . Family history of adverse reaction to anesthesia    nausea  .  Fracture    fracture  . Hypertension   . Pneumonia    as a child    Family History: Family History  Problem Relation Age of Onset  . Pancreatic cancer Father     Social History   Socioeconomic History  . Marital status: Married    Spouse name: Not on file  . Number of children: Not on file  . Years of education: Not on file  . Highest education level: Not on file  Occupational History  . Not on file  Tobacco Use  . Smoking status: Former Smoker    Types: Cigarettes  . Smokeless tobacco: Never Used  . Tobacco comment:  " quit smoking cigarettes in my 20's"  Vaping Use  . Vaping Use: Never used  Substance and Sexual Activity  . Alcohol use: Yes    Comment: occ.  . Drug use: Yes    Types: Marijuana  . Sexual activity: Not on file  Other Topics Concern  . Not on file  Social History Narrative  . Not on file   Social Determinants of Health   Financial Resource Strain: Not on file  Food Insecurity: Not on file  Transportation Needs: Not on file  Physical Activity: Not on file  Stress: Not on file  Social Connections: Not on file  Intimate Partner Violence: Not on file      Review of Systems  Constitutional: Negative for chills, fatigue and unexpected weight change.  HENT: Negative for congestion, postnasal drip, rhinorrhea, sneezing and sore throat.   Eyes: Negative for redness.  Respiratory: Negative for cough, chest tightness and  shortness of breath.   Cardiovascular: Negative for chest pain and palpitations.  Gastrointestinal: Negative for abdominal pain, constipation, diarrhea, nausea and vomiting.  Genitourinary: Negative for dysuria and frequency.  Musculoskeletal: Negative for arthralgias, back pain, joint swelling and neck pain.  Skin: Negative for rash.  Neurological: Negative for tremors and numbness.  Hematological: Negative for adenopathy. Does not bruise/bleed easily.  Psychiatric/Behavioral: Negative for behavioral problems (Depression), sleep  disturbance and suicidal ideas. The patient is not nervous/anxious.     Vital Signs: BP (!) 160/94   Pulse 83   Temp 98.9 F (37.2 C)   Resp 16   Ht 5\' 10"  (1.778 m)   Wt 233 lb (105.7 kg)   SpO2 98%   BMI 33.43 kg/m    Physical Exam Vitals reviewed.  Constitutional:      Appearance: Normal appearance. He is normal weight.  Cardiovascular:     Rate and Rhythm: Normal rate and regular rhythm.     Pulses: Normal pulses.     Heart sounds: Normal heart sounds.  Pulmonary:     Effort: Pulmonary effort is normal.     Breath sounds: Normal breath sounds.  Abdominal:     General: Abdomen is flat.     Palpations: Abdomen is soft.  Musculoskeletal:        General: Normal range of motion.     Cervical back: Normal range of motion.  Skin:    General: Skin is warm.  Neurological:     General: No focal deficit present.     Mental Status: He is alert and oriented to person, place, and time. Mental status is at baseline.  Psychiatric:        Mood and Affect: Mood normal.        Behavior: Behavior normal.        Thought Content: Thought content normal.        Judgment: Judgment normal.    Assessment/Plan: 1. Essential hypertension Refill all previously prescribed medications, encouraged to monitor BP readings at home and contact office if BP remains elevated after restarting medications Encouraged to adhere to follow-up appointments in order to avoid gaps in medication coverage - amLODipine (NORVASC) 10 MG tablet; TAKE 1 TABLET BY MOUTH ONCE A DAY.  Dispense: 30 tablet; Refill: 3 - hydrochlorothiazide (HYDRODIURIL) 25 MG tablet; TAKE 1 TABLET BY MOUTH EVERY MORNING.  Dispense: 30 tablet; Refill: 3 - losartan (COZAAR) 50 MG tablet; TAKE 1 TABLET BY MOUTH EVERY MORNING.  Dispense: 30 tablet; Refill: 3  2. Screen for colon cancer Encouraged to call [employer]olog[employer] company to discuss out of pocket expenses and possible financial aid due to lack of insurance coverage  3. Encounter for  blood test Encouraged to have labs completed for monitoring  General Counseling: [name] verbalizes understanding of the findings of todays visit and agrees with plan of treatment. I have discussed any further diagnostic evaluation that may be needed or ordered today. We also reviewed his medications today. he has been encouraged to call the office with any questions or concerns that should arise related to todays visit.   Meds ordered this encounter  Medications  . amLODipine (NORVASC) 10 MG tablet    Sig: TAKE 1 TABLET BY MOUTH ONCE A DAY.    Dispense:  30 tablet    Refill:  3  . hydrochlorothiazide (HYDRODIURIL) 25 MG tablet    Sig: TAKE 1 TABLET BY MOUTH EVERY MORNING.    Dispense:  30 tablet    Refill:  3  .  losartan (COZAAR) 50 MG tablet    Sig: TAKE 1 TABLET BY MOUTH EVERY MORNING.    Dispense:  30 tablet    Refill:  3    Time spent: 30 Minutes Time spent includes review of chart, medications, test results and follow-up plan with the patient.  This patient was seen by Leeanne Deed AGNP-C in Collaboration with Dr Lyndon Code as a part of collaborative care agreement     Lubertha Basque. Ilo Beamon AGNP-C Internal medicine

## 2020-07-12 ENCOUNTER — Encounter: Payer: Self-pay | Admitting: Hospice and Palliative Medicine

## 2020-10-12 ENCOUNTER — Other Ambulatory Visit: Payer: Self-pay

## 2020-10-12 DIAGNOSIS — I10 Essential (primary) hypertension: Secondary | ICD-10-CM

## 2020-10-12 MED ORDER — LOSARTAN POTASSIUM 50 MG PO TABS: ORAL_TABLET | ORAL | 1 refills | Status: DC

## 2020-10-12 MED ORDER — AMLODIPINE BESYLATE 10 MG PO TABS
ORAL_TABLET | ORAL | 1 refills | Status: DC
Start: 2020-10-12 — End: 2020-11-03

## 2020-10-12 MED ORDER — HYDROCHLOROTHIAZIDE 25 MG PO TABS: ORAL_TABLET | ORAL | 1 refills | Status: DC

## 2020-11-03 ENCOUNTER — Other Ambulatory Visit: Payer: Self-pay

## 2020-11-03 ENCOUNTER — Encounter: Payer: Self-pay | Admitting: Nurse Practitioner

## 2020-11-03 ENCOUNTER — Ambulatory Visit: Payer: Self-pay | Admitting: Nurse Practitioner

## 2020-11-03 VITALS — BP 150/92 | HR 84 | Temp 97.8°F | Resp 16 | Ht 70.0 in | Wt 234.0 lb

## 2020-11-03 DIAGNOSIS — I10 Essential (primary) hypertension: Secondary | ICD-10-CM

## 2020-11-03 DIAGNOSIS — E6609 Other obesity due to excess calories: Secondary | ICD-10-CM

## 2020-11-03 DIAGNOSIS — Z6833 Body mass index (BMI) 33.0-33.9, adult: Secondary | ICD-10-CM

## 2020-11-03 DIAGNOSIS — E782 Mixed hyperlipidemia: Secondary | ICD-10-CM

## 2020-11-03 MED ORDER — AMLODIPINE BESYLATE 10 MG PO TABS: ORAL_TABLET | ORAL | 1 refills | Status: DC

## 2020-11-03 MED ORDER — LOSARTAN POTASSIUM 50 MG PO TABS: ORAL_TABLET | ORAL | 1 refills | Status: DC

## 2020-11-03 MED ORDER — HYDROCHLOROTHIAZIDE 25 MG PO TABS: ORAL_TABLET | ORAL | 1 refills | Status: DC

## 2020-11-03 NOTE — Progress Notes (Signed)
[facility] [address] [place], [place] [address]  Internal MEDICINE  Office Visit Note  Patient Name: Jordan Cross  [id number]  [id number]  Date of Service: 11/03/2020  Chief Complaint  Patient presents with   Follow-up   Hypertension    HPI Helaman always has elevated BP in office but when he checks his BP at home or at work it is usually 130s/80s. Elevated today initially and then rechecked twice in office and still elevated. He is currently taking losartan, hydrochlorothiazide and amlodipine. Discussed checking BP daily at home and recording them in a notebook. Patient knows he is overweight and that losing weight will help improve his BP. He understand what foods to eat and that physical activity will help improve his BP as well.     Current Medication: Outpatient Encounter Medications as of 11/03/2020  Medication Sig   [DISCONTINUED] amLODipine (NORVASC) 10 MG tablet TAKE 1 TABLET BY MOUTH ONCE A DAY.   [DISCONTINUED] hydrochlorothiazide (HYDRODIURIL) 25 MG tablet TAKE 1 TABLET BY MOUTH EVERY MORNING.   [DISCONTINUED] losartan (COZAAR) 50 MG tablet TAKE 1 TABLET BY MOUTH EVERY MORNING.   amLODipine (NORVASC) 10 MG tablet TAKE 1 TABLET BY MOUTH ONCE A DAY.   hydrochlorothiazide (HYDRODIURIL) 25 MG tablet TAKE 1 TABLET BY MOUTH EVERY MORNING.   losartan (COZAAR) 50 MG tablet TAKE 1 TABLET BY MOUTH EVERY MORNING.   No facility-administered encounter medications on file as of 11/03/2020.    Surgical History: Past Surgical History:  Procedure Laterality Date   MULTIPLE TOOTH EXTRACTIONS     ORIF ANKLE FRACTURE Right 12/11/2017   Procedure: OPEN REDUCTION INTERNAL FIXATION (ORIF) RIGHT ANKLE FRACTURE;  Surgeon: Myrene Galas, MD;  Location: [facility] OR;  Service: Orthopedics;  Laterality: Right;    Medical History: Past Medical History:  Diagnosis Date   Family history of adverse reaction to anesthesia    nausea   Fracture    fracture   Hypertension    Pneumonia     as a child    Family History: Family History  Problem Relation Age of Onset   Pancreatic cancer Father     Social History   Socioeconomic History   Marital status: Married    Spouse name: Not on file   Number of children: Not on file   Years of education: Not on file   Highest education level: Not on file  Occupational History   Not on file  Tobacco Use   Smoking status: Former    Types: Cigarettes   Smokeless tobacco: Never   Tobacco comments:     " quit smoking cigarettes in my 20's"  Vaping Use   Vaping Use: Never used  Substance and Sexual Activity   Alcohol use: Yes    Comment: occ.   Drug use: Yes    Types: Marijuana   Sexual activity: Not on file  Other Topics Concern   Not on file  Social History Narrative   Not on file   Social Determinants of Health   Financial Resource Strain: Not on file  Food Insecurity: Not on file  Transportation Needs: Not on file  Physical Activity: Not on file  Stress: Not on file  Social Connections: Not on file  Intimate Partner Violence: Not on file      Review of Systems  Constitutional:  Negative for chills, fatigue and unexpected weight change.  HENT:  Negative for congestion, rhinorrhea, sneezing and sore throat.   Eyes:  Negative for redness.  Respiratory:  Negative for cough, chest tightness and shortness of breath.   Cardiovascular:  Negative for chest pain and palpitations.  Gastrointestinal:  Negative for abdominal pain, constipation, diarrhea, nausea and vomiting.  Genitourinary:  Negative for dysuria and frequency.  Musculoskeletal:  Negative for arthralgias, back pain, joint swelling and neck pain.  Skin:  Negative for rash.  Neurological: Negative.  Negative for tremors and numbness.  Hematological:  Negative for adenopathy. Does not bruise/bleed easily.  Psychiatric/Behavioral:  Negative for behavioral problems (Depression), sleep disturbance and suicidal ideas. The patient is not nervous/anxious.     Vital Signs: BP (!) 150/92   Pulse 84   Temp 97.8 F (36.6 C)   Resp 16   Ht 5\' 10"  (1.778 m)   Wt 234 lb (106.1 kg)   SpO2 98%   BMI 33.58 kg/m    Physical Exam Constitutional:      General: He is not in acute distress.    Appearance: He is well-developed. He is not diaphoretic.  HENT:     Head: Normocephalic and atraumatic.     Mouth/Throat:     Pharynx: No oropharyngeal exudate.  Eyes:     Pupils: Pupils are equal, round, and reactive to light.  Neck:     Thyroid: No thyromegaly.     Vascular: No JVD.     Trachea: No tracheal deviation.  Cardiovascular:     Rate and Rhythm: Normal rate and regular rhythm.     Heart sounds: Normal heart sounds. No murmur heard.   No friction rub. No gallop.  Pulmonary:     Effort: Pulmonary effort is normal. No respiratory distress.     Breath sounds: No wheezing or rales.  Chest:     Chest wall: No tenderness.  Abdominal:     General: Bowel sounds are normal.     Palpations: Abdomen is soft.  Musculoskeletal:        General: Normal range of motion.     Cervical back: Normal range of motion and neck supple.  Lymphadenopathy:     Cervical: No cervical adenopathy.  Skin:    General: Skin is warm and dry.  Neurological:     Mental Status: He is alert and oriented to person, place, and time.     Cranial Nerves: No cranial nerve deficit.  Psychiatric:        Behavior: Behavior normal.        Thought Content: Thought content normal.        Judgment: Judgment normal.     Assessment/Plan: 1. Essential hypertension Patient instructed to take BP at home at least once a day and write it down. Instructed to bring BP log to next appointment in 1 month. No changes made today, if BP stable at home will not make any changes next visit. If elevated at home will adjust accordingly.  - amLODipine (NORVASC) 10 MG tablet; TAKE 1 TABLET BY MOUTH ONCE A DAY.  Dispense: 30 tablet; Refill: 1 - hydrochlorothiazide (HYDRODIURIL) 25 MG tablet;  TAKE 1 TABLET BY MOUTH EVERY MORNING.  Dispense: 30 tablet; Refill: 1 - losartan (COZAAR) 50 MG tablet; TAKE 1 TABLET BY MOUTH EVERY MORNING.  Dispense: 30 tablet; Refill: 1  2. Class 1 obesity due to excess calories without serious comorbidity with body mass index (BMI) of 33.0 to 33.9 in adult Is working on increasing physical activity and diet modifications to help him lose weight. Will discuss further at next office visit.   3. Mixed hyperlipidemia Will order lab to  recheck at next office visit.    General Counseling: Wilborn verbalizes understanding of the findings of todays visit and agrees with plan of treatment. I have discussed any further diagnostic evaluation that may be needed or ordered today. We also reviewed his medications today. he has been encouraged to call the office with any questions or concerns that should arise related to todays visit.    No orders of the defined types were placed in this encounter.   Meds ordered this encounter  Medications   amLODipine (NORVASC) 10 MG tablet    Sig: TAKE 1 TABLET BY MOUTH ONCE A DAY.    Dispense:  30 tablet    Refill:  1   hydrochlorothiazide (HYDRODIURIL) 25 MG tablet    Sig: TAKE 1 TABLET BY MOUTH EVERY MORNING.    Dispense:  30 tablet    Refill:  1   losartan (COZAAR) 50 MG tablet    Sig: TAKE 1 TABLET BY MOUTH EVERY MORNING.    Dispense:  30 tablet    Refill:  1    Return in about 1 month (around 12/04/2020) for F/U bring home BP log , Jasie Meleski PCP.   Total time spent:20 Minutes Time spent includes review of chart, medications, test results, and follow up plan with the patient.   [place] Controlled Substance Database was reviewed by me.  This patient was seen by Sallyanne Kuster, FNP-C in collaboration with Dr. Beverely Risen as a part of collaborative care agreement.   Braedon Sjogren R. Tedd Sias, MSN, FNP-C Internal medicine

## 2020-12-07 ENCOUNTER — Ambulatory Visit (INDEPENDENT_AMBULATORY_CARE_PROVIDER_SITE_OTHER): Payer: 59 | Admitting: Nurse Practitioner

## 2020-12-07 ENCOUNTER — Encounter: Payer: Self-pay | Admitting: Nurse Practitioner

## 2020-12-07 ENCOUNTER — Other Ambulatory Visit: Payer: Self-pay

## 2020-12-07 VITALS — BP 142/86 | HR 89 | Temp 98.7°F | Resp 16 | Ht 70.5 in | Wt 235.6 lb

## 2020-12-07 DIAGNOSIS — E782 Mixed hyperlipidemia: Secondary | ICD-10-CM

## 2020-12-07 DIAGNOSIS — E6609 Other obesity due to excess calories: Secondary | ICD-10-CM

## 2020-12-07 DIAGNOSIS — Z6833 Body mass index (BMI) 33.0-33.9, adult: Secondary | ICD-10-CM | POA: Diagnosis not present

## 2020-12-07 DIAGNOSIS — I10 Essential (primary) hypertension: Secondary | ICD-10-CM

## 2020-12-07 DIAGNOSIS — Z0001 Encounter for general adult medical examination with abnormal findings: Secondary | ICD-10-CM

## 2020-12-07 DIAGNOSIS — R300 Dysuria: Secondary | ICD-10-CM | POA: Diagnosis not present

## 2020-12-07 MED ORDER — HYDROCHLOROTHIAZIDE 25 MG PO TABS: ORAL_TABLET | ORAL | 5 refills | Status: DC

## 2020-12-07 MED ORDER — AMLODIPINE BESYLATE 10 MG PO TABS: 10.0000 mg | ORAL_TABLET | Freq: Every day | ORAL | 5 refills | Status: DC

## 2020-12-07 MED ORDER — LOSARTAN POTASSIUM 50 MG PO TABS: 50.0000 mg | ORAL_TABLET | Freq: Every day | ORAL | 5 refills | Status: DC

## 2020-12-07 NOTE — Progress Notes (Signed)
[facility] [address] [place], [place] [address]  Internal MEDICINE  Office Visit Note  Patient Name: Jordan Cross  [id number]  [id number]  Date of Service: 12/07/2020  Chief Complaint  Patient presents with   Annual Exam   Hypertension    HPI Jordan Cross presents for an annual well visit and physical exam. he has a history of hypertension and hyperlipidemia.  He has declined the COVID-vaccine.  He is working on getting life insurance right now and has declined getting routine labs for his annual visit today.  He also wants to hold off on routine screening colonoscopy and routine PSA level to screen for prostate cancer.  He had labs ordered in November 2021 and never had them drawn then either. His blood pressure is elevated today and remains elevated when rechecked, see vitals.  He reports that his blood pressure is 140s/80s at home. He declined any adjustment in his medication. He needs refills of his medications and the only medications that he is on are 3 blood pressure medications.  He denies any chronic pain or new acute pains at this time.  He works full-time and lives at home with family.  He has no other concerns or questions today.   Current Medication: Outpatient Encounter Medications as of 12/07/2020  Medication Sig   [DISCONTINUED] amLODipine (NORVASC) 10 MG tablet TAKE 1 TABLET BY MOUTH ONCE A DAY.   [DISCONTINUED] hydrochlorothiazide (HYDRODIURIL) 25 MG tablet TAKE 1 TABLET BY MOUTH EVERY MORNING.   [DISCONTINUED] losartan (COZAAR) 50 MG tablet TAKE 1 TABLET BY MOUTH EVERY MORNING.   amLODipine (NORVASC) 10 MG tablet Take 1 tablet (10 mg total) by mouth daily. TAKE 1 TABLET BY MOUTH ONCE A DAY.TAKE 1 TABLET BY MOUTH ONCE A DAY.   hydrochlorothiazide (HYDRODIURIL) 25 MG tablet TAKE 1 TABLET BY MOUTH EVERY MORNING.   losartan (COZAAR) 50 MG tablet Take 1 tablet (50 mg total) by mouth daily. TAKE 1 TABLET BY MOUTH EVERY MORNING.   No facility-administered encounter  medications on file as of 12/07/2020.    Surgical History: Past Surgical History:  Procedure Laterality Date   MULTIPLE TOOTH EXTRACTIONS     ORIF ANKLE FRACTURE Right 12/11/2017   Procedure: OPEN REDUCTION INTERNAL FIXATION (ORIF) RIGHT ANKLE FRACTURE;  Surgeon: Myrene Galas, MD;  Location: [facility] OR;  Service: Orthopedics;  Laterality: Right;    Medical History: Past Medical History:  Diagnosis Date   Family history of adverse reaction to anesthesia    nausea   Fracture    fracture   Hypertension    Pneumonia    as a child    Family History: Family History  Problem Relation Age of Onset   Pancreatic cancer Father     Social History   Socioeconomic History   Marital status: Married    Spouse name: Not on file   Number of children: Not on file   Years of education: Not on file   Highest education level: Not on file  Occupational History   Not on file  Tobacco Use   Smoking status: Former    Types: Cigarettes   Smokeless tobacco: Never   Tobacco comments:     " quit smoking cigarettes in my 20's"  Vaping Use   Vaping Use: Never used  Substance and Sexual Activity   Alcohol use: Yes    Comment: occ.   Drug use: Yes    Types: Marijuana   Sexual activity: Not on file  Other Topics Concern   Not  on file  Social History Narrative   Not on file   Social Determinants of Health   Financial Resource Strain: Not on file  Food Insecurity: Not on file  Transportation Needs: Not on file  Physical Activity: Not on file  Stress: Not on file  Social Connections: Not on file  Intimate Partner Violence: Not on file      Review of Systems  Constitutional:  Negative for activity change, appetite change, chills, fatigue, fever and unexpected weight change.  HENT: Negative.  Negative for congestion, ear pain, rhinorrhea, sore throat and trouble swallowing.   Eyes: Negative.   Respiratory: Negative.  Negative for cough, chest tightness, shortness of breath and wheezing.    Cardiovascular: Negative.  Negative for chest pain.  Gastrointestinal: Negative.  Negative for abdominal pain, blood in stool, constipation, diarrhea, nausea and vomiting.  Endocrine: Negative.   Genitourinary: Negative.  Negative for difficulty urinating, dysuria, frequency, hematuria and urgency.  Musculoskeletal: Negative.  Negative for arthralgias, back pain, joint swelling, myalgias and neck pain.  Skin: Negative.  Negative for rash and wound.  Allergic/Immunologic: Negative.  Negative for immunocompromised state.  Neurological: Negative.  Negative for dizziness, seizures, numbness and headaches.  Hematological: Negative.   Psychiatric/Behavioral: Negative.  Negative for behavioral problems, self-injury and suicidal ideas. The patient is not nervous/anxious.    Vital Signs: BP (!) 142/86 Comment: 152/100  Pulse 89   Temp 98.7 F (37.1 C)   Resp 16   Ht 5' 10.5" (1.791 m)   Wt 235 lb 9.6 oz (106.9 kg)   SpO2 98%   BMI 33.33 kg/m    Physical Exam Vitals reviewed.  Constitutional:      General: He is not in acute distress.    Appearance: He is well-developed. He is not diaphoretic.  HENT:     Head: Normocephalic and atraumatic.     Right Ear: External ear normal.     Left Ear: External ear normal.     Nose: Nose normal.     Mouth/Throat:     Pharynx: No oropharyngeal exudate.  Eyes:     General: No scleral icterus.       Right eye: No discharge.        Left eye: No discharge.     Conjunctiva/sclera: Conjunctivae normal.     Pupils: Pupils are equal, round, and reactive to light.  Neck:     Thyroid: No thyromegaly.     Vascular: No JVD.     Trachea: No tracheal deviation.  Cardiovascular:     Rate and Rhythm: Normal rate and regular rhythm.     Heart sounds: Normal heart sounds. No murmur heard.   No friction rub. No gallop.  Pulmonary:     Effort: Pulmonary effort is normal. No respiratory distress.     Breath sounds: Normal breath sounds. No stridor. No  wheezing or rales.  Chest:     Chest wall: No tenderness.  Abdominal:     General: Bowel sounds are normal. There is no distension.     Palpations: Abdomen is soft. There is no mass.     Tenderness: There is no abdominal tenderness. There is no guarding or rebound.  Musculoskeletal:        General: No tenderness or deformity. Normal range of motion.     Cervical back: Normal range of motion and neck supple.  Lymphadenopathy:     Cervical: No cervical adenopathy.  Skin:    General: Skin is warm and dry.  Coloration: Skin is not pale.     Findings: No erythema or rash.  Neurological:     Mental Status: He is alert.     Cranial Nerves: No cranial nerve deficit.     Motor: No abnormal muscle tone.     Coordination: Coordination normal.     Deep Tendon Reflexes: Reflexes are normal and symmetric.  Psychiatric:        Behavior: Behavior normal.        Thought Content: Thought content normal.        Judgment: Judgment normal.       Assessment/Plan: 1. Encounter for routine adult health examination with abnormal findings Age-appropriate preventive screenings and vaccinations discussed, annual physical exam completed. Routine labs for health maintenance were declined at this time. PHM updated.   2. Essential hypertension Blood pressure elevated but he declined any adjustment to his medication. Refills ordered.  - amLODipine (NORVASC) 10 MG tablet; Take 1 tablet (10 mg total) by mouth daily. TAKE 1 TABLET BY MOUTH ONCE A DAY.TAKE 1 TABLET BY MOUTH ONCE A DAY.  Dispense: 30 tablet; Refill: 5 - hydrochlorothiazide (HYDRODIURIL) 25 MG tablet; TAKE 1 TABLET BY MOUTH EVERY MORNING.  Dispense: 30 tablet; Refill: 5 - losartan (COZAAR) 50 MG tablet; Take 1 tablet (50 mg total) by mouth daily. TAKE 1 TABLET BY MOUTH EVERY MORNING.  Dispense: 30 tablet; Refill: 5  3. Mixed hyperlipidemia Has a patient-reported history of hyperlipidemia but refused labs and is not currently on medication.    4. Class 1 obesity due to excess calories without serious comorbidity with body mass index (BMI) of 33.0 to 33.9 in adult Patient is obese and is aware that losing weight will improve his health. Briefly discussed diet and lifestyle modifications but patient stated he knows what he needs to do.  Obesity Counseling: Risk Assessment: An assessment of behavioral risk factors was made today and includes lack of exercise sedentary lifestyle, lack of portion control and poor dietary habits.  Risk Modification Advice: She was counseled on portion control guidelines. Discussed decreasing high carb and high sugar foods. Encouraged increasing lean proteins. The detrimental long term effects of obesity on her health and ongoing poor compliance was also discussed with the patient.  5. Dysuria Routine urinalysis done.  - UA/M w/rflx Culture, Routine - Microscopic Examination      General Counseling: Jahmeer verbalizes understanding of the findings of todays visit and agrees with plan of treatment. I have discussed any further diagnostic evaluation that may be needed or ordered today. We also reviewed his medications today. he has been encouraged to call the office with any questions or concerns that should arise related to todays visit.    Orders Placed This Encounter  Procedures   Microscopic Examination   UA/M w/rflx Culture, Routine    Meds ordered this encounter  Medications   amLODipine (NORVASC) 10 MG tablet    Sig: Take 1 tablet (10 mg total) by mouth daily. TAKE 1 TABLET BY MOUTH ONCE A DAY.TAKE 1 TABLET BY MOUTH ONCE A DAY.    Dispense:  30 tablet    Refill:  5   hydrochlorothiazide (HYDRODIURIL) 25 MG tablet    Sig: TAKE 1 TABLET BY MOUTH EVERY MORNING.    Dispense:  30 tablet    Refill:  5   losartan (COZAAR) 50 MG tablet    Sig: Take 1 tablet (50 mg total) by mouth daily. TAKE 1 TABLET BY MOUTH EVERY MORNING.    Dispense:  30  tablet    Refill:  5    Return in about 6 months  (around 06/06/2021) for F/U, med refill, Azlynn Mitnick PCP.   Total time spent:30 Minutes Time spent includes review of chart, medications, test results, and follow up plan with the patient.   [place] Controlled Substance Database was reviewed by me.  This patient was seen by Sallyanne Kuster, FNP-C in collaboration with Dr. Beverely Risen as a part of collaborative care agreement.  Ranveer Wahlstrom R. Tedd Sias, MSN, FNP-C Internal medicine

## 2020-12-08 LAB — UA/M W/RFLX CULTURE, ROUTINE
Bilirubin, UA: NEGATIVE
Glucose, UA: NEGATIVE
Ketones, UA: NEGATIVE
Leukocytes,UA: NEGATIVE
Nitrite, UA: NEGATIVE
Protein,UA: NEGATIVE
RBC, UA: NEGATIVE
Specific Gravity, UA: 1.019 (ref 1.005–1.030)
Urobilinogen, Ur: 0.2 mg/dL (ref 0.2–1.0)
pH, UA: 6.5 (ref 5.0–7.5)

## 2020-12-08 LAB — MICROSCOPIC EXAMINATION
Bacteria, UA: NONE SEEN
Casts: NONE SEEN /lpf
Epithelial Cells (non renal): NONE SEEN /hpf (ref 0–10)
WBC, UA: NONE SEEN /hpf (ref 0–5)

## 2021-06-06 ENCOUNTER — Encounter: Payer: Self-pay | Admitting: Nurse Practitioner

## 2021-06-06 ENCOUNTER — Ambulatory Visit (INDEPENDENT_AMBULATORY_CARE_PROVIDER_SITE_OTHER): Payer: 59 | Admitting: Nurse Practitioner

## 2021-06-06 ENCOUNTER — Other Ambulatory Visit: Payer: Self-pay

## 2021-06-06 VITALS — BP 140/80 | HR 70 | Temp 98.6°F | Resp 16 | Ht 70.0 in | Wt 228.6 lb

## 2021-06-06 DIAGNOSIS — E6609 Other obesity due to excess calories: Secondary | ICD-10-CM | POA: Diagnosis not present

## 2021-06-06 DIAGNOSIS — I10 Essential (primary) hypertension: Secondary | ICD-10-CM | POA: Diagnosis not present

## 2021-06-06 DIAGNOSIS — E782 Mixed hyperlipidemia: Secondary | ICD-10-CM

## 2021-06-06 DIAGNOSIS — Z6832 Body mass index (BMI) 32.0-32.9, adult: Secondary | ICD-10-CM | POA: Diagnosis not present

## 2021-06-06 MED ORDER — LOSARTAN POTASSIUM 50 MG PO TABS: 50.0000 mg | ORAL_TABLET | Freq: Every day | ORAL | 1 refills | Status: DC

## 2021-06-06 MED ORDER — AMLODIPINE BESYLATE 10 MG PO TABS: 10.0000 mg | ORAL_TABLET | Freq: Every day | ORAL | 1 refills | Status: DC

## 2021-06-06 MED ORDER — HYDROCHLOROTHIAZIDE 25 MG PO TABS: ORAL_TABLET | ORAL | 1 refills | Status: DC

## 2021-06-06 NOTE — Progress Notes (Signed)
[facility] ?[address] ?[place], [place] [address] ? ?Internal MEDICINE  ?Office Visit Note ? ?Patient Name: Jordan Cross ? [id number]  ?[id number] ? ?Date of Service: 06/06/2021 ? ?Chief Complaint  ?Patient presents with  ? Follow-up  ? Hypertension  ? Medication Refill  ? ? ?HPI ?Jordan Cross presents for a follow up visit for hypertension and medication refills. His blood pressure is stable with current medications and he is in need of medication refills. He has lost 6 lbs since his previous office visit. No other questions or concerns.  ? ? ? ? ?Current Medication: ?Outpatient Encounter Medications as of 06/06/2021  ?Medication Sig  ? [DISCONTINUED] amLODipine (NORVASC) 10 MG tablet Take 1 tablet (10 mg total) by mouth daily. TAKE 1 TABLET BY MOUTH ONCE A DAY.TAKE 1 TABLET BY MOUTH ONCE A DAY.  ? [DISCONTINUED] hydrochlorothiazide (HYDRODIURIL) 25 MG tablet TAKE 1 TABLET BY MOUTH EVERY MORNING.  ? [DISCONTINUED] losartan (COZAAR) 50 MG tablet Take 1 tablet (50 mg total) by mouth daily. TAKE 1 TABLET BY MOUTH EVERY MORNING.  ? amLODipine (NORVASC) 10 MG tablet Take 1 tablet (10 mg total) by mouth daily. TAKE 1 TABLET BY MOUTH ONCE A DAY.TAKE 1 TABLET BY MOUTH ONCE A DAY.  ? hydrochlorothiazide (HYDRODIURIL) 25 MG tablet TAKE 1 TABLET BY MOUTH EVERY MORNING.  ? losartan (COZAAR) 50 MG tablet Take 1 tablet (50 mg total) by mouth daily. TAKE 1 TABLET BY MOUTH EVERY MORNING.  ? ?No facility-administered encounter medications on file as of 06/06/2021.  ? ? ?Surgical History: ?Past Surgical History:  ?Procedure Laterality Date  ? MULTIPLE TOOTH EXTRACTIONS    ? ORIF ANKLE FRACTURE Right 12/11/2017  ? Procedure: OPEN REDUCTION INTERNAL FIXATION (ORIF) RIGHT ANKLE FRACTURE;  Surgeon: Myrene Galas, MD;  Location: [facility] OR;  Service: Orthopedics;  Laterality: Right;  ? ? ?Medical History: ?Past Medical History:  ?Diagnosis Date  ? Family history of adverse reaction to anesthesia   ? nausea  ? Fracture   ? fracture  ?  Hypertension   ? Pneumonia   ? as a child  ? ? ?Family History: ?Family History  ?Problem Relation Age of Onset  ? Pancreatic cancer Father   ? ? ?Social History  ? ?Socioeconomic History  ? Marital status: Married  ?  Spouse name: Not on file  ? Number of children: Not on file  ? Years of education: Not on file  ? Highest education level: Not on file  ?Occupational History  ? Not on file  ?Tobacco Use  ? Smoking status: Former  ?  Types: Cigarettes  ? Smokeless tobacco: Never  ? Tobacco comments:  ?   " quit smoking cigarettes in my 20's"  ?Vaping Use  ? Vaping Use: Never used  ?Substance and Sexual Activity  ? Alcohol use: Yes  ?  Comment: occ.  ? Drug use: Yes  ?  Types: Marijuana  ? Sexual activity: Not on file  ?Other Topics Concern  ? Not on file  ?Social History Narrative  ? Not on file  ? ?Social Determinants of Health  ? ?Financial Resource Strain: Not on file  ?Food Insecurity: Not on file  ?Transportation Needs: Not on file  ?Physical Activity: Not on file  ?Stress: Not on file  ?Social Connections: Not on file  ?Intimate Partner Violence: Not on file  ? ? ? ? ?Review of Systems  ?Constitutional:  Negative for chills, fatigue and unexpected weight change.  ?HENT:  Negative for congestion, rhinorrhea, sneezing  and sore throat.   ?Eyes:  Negative for redness.  ?Respiratory:  Negative for cough, chest tightness and shortness of breath.   ?Cardiovascular:  Negative for chest pain and palpitations.  ?Gastrointestinal:  Negative for abdominal pain, constipation, diarrhea, nausea and vomiting.  ?Genitourinary:  Negative for dysuria and frequency.  ?Musculoskeletal:  Negative for arthralgias, back pain, joint swelling and neck pain.  ?Skin:  Negative for rash.  ?Neurological: Negative.  Negative for tremors and numbness.  ?Hematological:  Negative for adenopathy. Does not bruise/bleed easily.  ?Psychiatric/Behavioral:  Negative for behavioral problems (Depression), sleep disturbance and suicidal ideas. The  patient is not nervous/anxious.   ? ?Vital Signs: ?BP 140/80   Pulse 70   Temp 98.6 ?F (37 ?C)   Resp 16   Ht  (1.778 m)   Wt 228 lb 9.6 oz (103.7 kg)   SpO2 97%   BMI 32.80 kg/m?  ? ? ?Physical Exam ?Vitals reviewed.  ?Constitutional:   ?   General: He is not in acute distress. ?   Appearance: Normal appearance. He is obese. He is not ill-appearing.  ?HENT:  ?   Head: Normocephalic and atraumatic.  ?Eyes:  ?   Pupils: Pupils are equal, round, and reactive to light.  ?Cardiovascular:  ?   Rate and Rhythm: Normal rate and regular rhythm.  ?Pulmonary:  ?   Effort: Pulmonary effort is normal. No respiratory distress.  ?Neurological:  ?   Mental Status: He is alert and oriented to person, place, and time.  ?Psychiatric:     ?   Mood and Affect: Mood normal.     ?   Behavior: Behavior normal.  ? ? ? ? ? ?Assessment/Plan: ?1. Essential hypertension ?BP stable with current medications, refills ordered ?- losartan (COZAAR) 50 MG tablet; Take 1 tablet (50 mg total) by mouth daily. TAKE 1 TABLET BY MOUTH EVERY MORNING.  Dispense: 90 tablet; Refill: 1 ?- amLODipine (NORVASC) 10 MG tablet; Take 1 tablet (10 mg total) by mouth daily. TAKE 1 TABLET BY MOUTH ONCE A DAY.TAKE 1 TABLET BY MOUTH ONCE A DAY.  Dispense: 90 tablet; Refill: 1 ?- hydrochlorothiazide (HYDRODIURIL) 25 MG tablet; TAKE 1 TABLET BY MOUTH EVERY MORNING.  Dispense: 90 tablet; Refill: 1 ? ?2. Mixed hyperlipidemia ?Stable, patient has had some weight loss so there may be improvement on his lipid panel when he has his labs drawn for his annual wellness visit.  ? ?3. Class 1 obesity due to excess calories without serious comorbidity with body mass index (BMI) of 32.0 to 32.9 in adult ?BMI slightly improved, patient lost 7 lbs since his previous office visit. Patient has made some diet and lifestyle modifications. Encouraged patient to continue.  ? ? ?General Counseling: Hisao verbalizes understanding of the findings of todays visit and agrees with plan of  treatment. I have discussed any further diagnostic evaluation that may be needed or ordered today. We also reviewed his medications today. he has been encouraged to call the office with any questions or concerns that should arise related to todays visit. ? ? ? ?No orders of the defined types were placed in this encounter. ? ? ?Meds ordered this encounter  ?Medications  ? losartan (COZAAR) 50 MG tablet  ?  Sig: Take 1 tablet (50 mg total) by mouth daily. TAKE 1 TABLET BY MOUTH EVERY MORNING.  ?  Dispense:  90 tablet  ?  Refill:  1  ? amLODipine (NORVASC) 10 MG tablet  ?  Sig: Take 1  tablet (10 mg total) by mouth daily. TAKE 1 TABLET BY MOUTH ONCE A DAY.TAKE 1 TABLET BY MOUTH ONCE A DAY.  ?  Dispense:  90 tablet  ?  Refill:  1  ? hydrochlorothiazide (HYDRODIURIL) 25 MG tablet  ?  Sig: TAKE 1 TABLET BY MOUTH EVERY MORNING.  ?  Dispense:  90 tablet  ?  Refill:  1  ? ? ?Return for previously scheduled, CPE, Macallan Ord PCP in september. ? ? ?Total time spent:30 Minutes ?Time spent includes review of chart, medications, test results, and follow up plan with the patient.  ? ?[place] Controlled Substance Database was reviewed by me. ? ?This patient was seen by Sallyanne Kuster, FNP-C in collaboration with Dr. Beverely Risen as a part of collaborative care agreement. ? ? ?Erlinda Solinger R. Tedd Sias, MSN, FNP-C ?Internal medicine  ?

## 2021-07-07 ENCOUNTER — Encounter: Payer: Self-pay | Admitting: Intensive Care

## 2021-07-07 ENCOUNTER — Other Ambulatory Visit: Payer: Self-pay

## 2021-07-07 ENCOUNTER — Emergency Department
Admission: EM | Admit: 2021-07-07 | Discharge: 2021-07-07 | Disposition: A | Discharge status: Home / Self Care | Payer: 59 | Attending: Emergency Medicine | Admitting: Emergency Medicine

## 2021-07-07 ENCOUNTER — Emergency Department: Payer: 59

## 2021-07-07 DIAGNOSIS — S61412A Laceration without foreign body of left hand, initial encounter: Secondary | ICD-10-CM | POA: Diagnosis not present

## 2021-07-07 DIAGNOSIS — S6992XA Unspecified injury of left wrist, hand and finger(s), initial encounter: Secondary | ICD-10-CM | POA: Diagnosis present

## 2021-07-07 DIAGNOSIS — W268XXA Contact with other sharp object(s), not elsewhere classified, initial encounter: Secondary | ICD-10-CM | POA: Diagnosis not present

## 2021-07-07 MED ORDER — CEPHALEXIN 500 MG PO CAPS: 500.0000 mg | ORAL_CAPSULE | Freq: Three times a day (TID) | ORAL | 0 refills | Status: AC

## 2021-07-07 NOTE — ED Notes (Signed)
Pt states he cut left pinky on metal piece on his house. Pt with laceration to left tip of pinky, scant bloody drainage present.  ?

## 2021-07-07 NOTE — ED Provider Notes (Signed)
? ?[facility] ?Provider Note ? ?Patient Contact: 4:36 PM (approximate) ? ? ?History  ? ?Extremity Laceration ? ? ?HPI ? ?Jordan Cross is a 56 y.o. male dents to the emergency department with the linear, partially circumferential laceration of the left fifth digit sustained accidentally with a metal breaker box.  No similar injuries in the past.  Tetanus status is up-to-date. ? ?  ? ? ?Physical Exam  ? ?Triage Vital Signs: ?ED Triage Vitals  ?Enc Vitals Group  ?   BP --   ?   Pulse --   ?   Resp --   ?   Temp --   ?   Temp src --   ?   SpO2 --   ?   Weight 07/07/21 1531 220 lb (99.8 kg)  ?   Height 07/07/21 1531 5\' 10"  (1.778 m)  ?   Head Circumference --   ?   Peak Flow --   ?   Pain Score 07/07/21 1530 1  ?   Pain Loc --   ?   Pain Edu? --   ?   Excl. in GC? --   ? ? ?Most recent vital signs: ?Vitals:  ? 07/07/21 1709  ?BP: (!) 143/100  ?Pulse: 86  ?Resp: 18  ?Temp: 97.9 ?F (36.6 ?C)  ?SpO2: 98%  ? ? ? ?General: Alert and in no acute distress. ?Eyes:  PERRL. EOMI. ?Head: No acute traumatic findings ?ENT: ?     Ears: Tms are pearly.  ?     Nose: No congestion/rhinnorhea. ?     Mouth/Throat: Mucous membranes are moist. ?Neck: No stridor. No cervical spine tenderness to palpation. ?Cardiovascular:  Good peripheral perfusion ?Respiratory: Normal respiratory effort without tachypnea or retractions. Lungs CTAB. Good air entry to the bases with no decreased or absent breath sounds. ?Gastrointestinal: Bowel sounds ?4 quadrants. Soft and nontender to palpation. No guarding or rigidity. No palpable masses. No distention. No CVA tenderness. ?Musculoskeletal: Full range of motion to all extremities.  No flexor or extensor tendon deficits appreciated with left fifth digit testing.  Palpable radial and ulnar pulses bilaterally and symmetrically.  Capillary refill less than 2 seconds on the left. ?Neurologic:  No gross focal neurologic deficits are appreciated.  ?Skin:   No rash noted ?Other: ? ? ?ED Results  / Procedures / Treatments  ? ?Labs ?(all labs ordered are listed, but only abnormal results are displayed) ?Labs Reviewed - No data to display ? ? ? ? ?RADIOLOGY ? ?I personally viewed and evaluated these images as part of my medical decision making, as well as reviewing the written report by the radiologist. ? ?ED Provider Interpretation: I personally reviewed x-ray of the left fifth digit and there was no acute bony abnormality visualized. ? ? ?PROCEDURES: ? ?Critical Care performed: No ? ?07/09/21.Laceration Repair ? ?Date/Time: 07/07/2021 6:03 PM ?Performed by: 07/09/2021 M, PA-C ?Authorized by: Pia Mau M, PA-C  ? ?Consent:  ?  Consent obtained:  Verbal ?  Risks discussed:  Infection and pain ?Universal protocol:  ?  Procedure explained and questions answered to patient or proxy's satisfaction: yes   ?  Patient identity confirmed:  Verbally with patient ?Laceration details:  ?  Length (cm):  2 ?  Depth (mm):  3 ?Pre-procedure details:  ?  Preparation:  Patient was prepped and draped in usual sterile fashion ?Exploration:  ?  Limited defect created (wound extended): no   ?  Contaminated: no   ?Treatment:  ?  Area cleansed with:  Povidone-iodine ?  Irrigation volume:  500 ?Skin repair:  ?  Repair method:  Tissue adhesive ?Approximation:  ?  Approximation:  Close ?Repair type:  ?  Repair type:  Simple ?Post-procedure details:  ?  Dressing:  Non-adherent dressing ? ? ?MEDICATIONS ORDERED IN ED: ?Medications - No data to display ? ? ?IMPRESSION / MDM / ASSESSMENT AND PLAN / ED COURSE  ?I reviewed the triage vital signs and the nursing notes. ?             ?               ? ?Differential diagnosis includes, but is not limited to, open distal tuft fracture, laceration... ? ? ?Assessment and plan ?Laceration ?56 year old male presents to the emergency department with a left fifth digit laceration that was repaired in the emergency department without complication.  I personally reviewed x-ray of the left fifth digit and  there was no acute bony abnormality or retained foreign body.  Patient's left digit was splinted in extension prior to discharge and he was sent home with Keflex.  Return precautions were given to return with new or worsening symptoms.  All patient questions were answered. ? ?  ? ? ?FINAL CLINICAL IMPRESSION(S) / ED DIAGNOSES  ? ?Final diagnoses:  ?Laceration of left hand without foreign body, initial encounter  ? ? ? ?Rx / DC Orders  ? ?ED Discharge Orders   ? ?      Ordered  ?  cephALEXin (KEFLEX) 500 MG capsule  3 times daily       ? 07/07/21 1737  ? ?  ?  ? ?  ? ? ? ?Note:  This document was prepared using Dragon voice recognition software and may include unintentional dictation errors. ?  ?Orvil Feil, [place] ?07/07/21 1806 ? ?  ?Jene Every, MD ?07/07/21 1807 ? ?

## 2021-07-07 NOTE — ED Triage Notes (Signed)
Patient has laceration noted to left hand, pinky. Reports smashing it into metal breaker ?

## 2021-07-07 NOTE — Discharge Instructions (Addendum)
Take Keflex three times daily for the next seven days.  

## 2021-10-19 ENCOUNTER — Other Ambulatory Visit: Payer: Self-pay | Admitting: Nurse Practitioner

## 2021-10-19 DIAGNOSIS — I10 Essential (primary) hypertension: Secondary | ICD-10-CM

## 2021-11-23 ENCOUNTER — Encounter: Payer: BLUE CROSS/BLUE SHIELD | Admitting: Nurse Practitioner

## 2021-12-09 ENCOUNTER — Ambulatory Visit (INDEPENDENT_AMBULATORY_CARE_PROVIDER_SITE_OTHER): Payer: BLUE CROSS/BLUE SHIELD | Admitting: Nurse Practitioner

## 2021-12-09 ENCOUNTER — Encounter: Payer: Self-pay | Admitting: Nurse Practitioner

## 2021-12-09 VITALS — BP 140/80 | HR 75 | Temp 98.3°F | Ht 70.0 in | Wt 228.8 lb

## 2021-12-09 DIAGNOSIS — E782 Mixed hyperlipidemia: Secondary | ICD-10-CM

## 2021-12-09 DIAGNOSIS — Z0001 Encounter for general adult medical examination with abnormal findings: Secondary | ICD-10-CM

## 2021-12-09 DIAGNOSIS — I10 Essential (primary) hypertension: Secondary | ICD-10-CM | POA: Diagnosis not present

## 2021-12-09 DIAGNOSIS — E6609 Other obesity due to excess calories: Secondary | ICD-10-CM | POA: Diagnosis not present

## 2021-12-09 DIAGNOSIS — Z6833 Body mass index (BMI) 33.0-33.9, adult: Secondary | ICD-10-CM

## 2021-12-09 MED ORDER — AMLODIPINE BESYLATE 10 MG PO TABS: 10.0000 mg | ORAL_TABLET | Freq: Every day | ORAL | 1 refills | Status: DC

## 2021-12-09 MED ORDER — HYDROCHLOROTHIAZIDE 25 MG PO TABS: 25.0000 mg | ORAL_TABLET | Freq: Every morning | ORAL | 1 refills | Status: DC

## 2021-12-09 MED ORDER — LOSARTAN POTASSIUM 50 MG PO TABS: 50.0000 mg | ORAL_TABLET | Freq: Every morning | ORAL | 1 refills | Status: DC

## 2021-12-09 NOTE — Progress Notes (Unsigned)
[facility] [address] [place], [place] [address]  Internal MEDICINE  Office Visit Note  Patient Name: Jordan Cross  [id number]  [id number]  Date of Service: 12/09/2021  Chief Complaint  Patient presents with   Annual Exam   Hypertension    HPI Jordan Cross presents for an annual well visit and physical exam. he has a history of ____.  Still working on getting life insurance       Current Medication: Outpatient Encounter Medications as of 12/09/2021  Medication Sig   [DISCONTINUED] amLODipine (NORVASC) 10 MG tablet TAKE 1 TABLET BY MOUTH ONCE A DAY   [DISCONTINUED] hydrochlorothiazide (HYDRODIURIL) 25 MG tablet TAKE 1 TABLET BY MOUTH EVERY MORNING   [DISCONTINUED] losartan (COZAAR) 50 MG tablet TAKE 1 TABLET BY MOUTH IN THE MORNING   amLODipine (NORVASC) 10 MG tablet Take 1 tablet (10 mg total) by mouth daily.   hydrochlorothiazide (HYDRODIURIL) 25 MG tablet Take 1 tablet (25 mg total) by mouth every morning.   losartan (COZAAR) 50 MG tablet Take 1 tablet (50 mg total) by mouth every morning.   No facility-administered encounter medications on file as of 12/09/2021.    Surgical History: Past Surgical History:  Procedure Laterality Date   MULTIPLE TOOTH EXTRACTIONS     ORIF ANKLE FRACTURE Right 12/11/2017   Procedure: OPEN REDUCTION INTERNAL FIXATION (ORIF) RIGHT ANKLE FRACTURE;  Surgeon: Myrene Galas, MD;  Location: [facility] OR;  Service: Orthopedics;  Laterality: Right;    Medical History: Past Medical History:  Diagnosis Date   Family history of adverse reaction to anesthesia    nausea   Fracture    fracture   Hypertension    Pneumonia    as a child    Family History: Family History  Problem Relation Age of Onset   Pancreatic cancer Father     Social History   Socioeconomic History   Marital status: Married    Spouse name: Not on file   Number of children: Not on file   Years of education: Not on file   Highest education level: Not on file   Occupational History   Not on file  Tobacco Use   Smoking status: Former    Types: Cigarettes   Smokeless tobacco: Never   Tobacco comments:     " quit smoking cigarettes in my 20's"  Vaping Use   Vaping Use: Never used  Substance and Sexual Activity   Alcohol use: Yes    Comment: occ.   Drug use: Yes    Types: Marijuana   Sexual activity: Not on file  Other Topics Concern   Not on file  Social History Narrative   Not on file   Social Determinants of Health   Financial Resource Strain: Not on file  Food Insecurity: Not on file  Transportation Needs: Not on file  Physical Activity: Not on file  Stress: Not on file  Social Connections: Not on file  Intimate Partner Violence: Not on file      Review of Systems  Constitutional:  Negative for activity change, appetite change, chills, fatigue, fever and unexpected weight change.  HENT: Negative.  Negative for congestion, ear pain, rhinorrhea, sore throat and trouble swallowing.   Eyes: Negative.   Respiratory: Negative.  Negative for cough, chest tightness, shortness of breath and wheezing.   Cardiovascular: Negative.  Negative for chest pain.  Gastrointestinal: Negative.  Negative for abdominal pain, blood in stool, constipation, diarrhea, nausea and vomiting.  Endocrine: Negative.   Genitourinary: Negative.  Negative for difficulty urinating, dysuria, frequency, hematuria and urgency.  Musculoskeletal: Negative.  Negative for arthralgias, back pain, joint swelling, myalgias and neck pain.  Skin: Negative.  Negative for rash and wound.  Allergic/Immunologic: Negative.  Negative for immunocompromised state.  Neurological: Negative.  Negative for dizziness, seizures, numbness and headaches.  Hematological: Negative.   Psychiatric/Behavioral: Negative.  Negative for behavioral problems, self-injury and suicidal ideas. The patient is not nervous/anxious.     Vital Signs: BP (!) 140/80 Comment: 166/102  Pulse 75   Temp  98.3 F (36.8 C)   Ht 5\' 10"  (1.778 m)   Wt 228 lb 12.8 oz (103.8 kg)   SpO2 98%   BMI 32.83 kg/m    Physical Exam Vitals reviewed.  Constitutional:      General: He is not in acute distress.    Appearance: He is well-developed. He is not diaphoretic.  HENT:     Head: Normocephalic and atraumatic.     Right Ear: External ear normal.     Left Ear: External ear normal.     Nose: Nose normal.     Mouth/Throat:     Pharynx: No oropharyngeal exudate.  Eyes:     General: No scleral icterus.       Right eye: No discharge.        Left eye: No discharge.     Conjunctiva/sclera: Conjunctivae normal.     Pupils: Pupils are equal, round, and reactive to light.  Neck:     Thyroid: No thyromegaly.     Vascular: No JVD.     Trachea: No tracheal deviation.  Cardiovascular:     Rate and Rhythm: Normal rate and regular rhythm.     Heart sounds: Normal heart sounds. No murmur heard.    No friction rub. No gallop.  Pulmonary:     Effort: Pulmonary effort is normal. No respiratory distress.     Breath sounds: Normal breath sounds. No stridor. No wheezing or rales.  Chest:     Chest wall: No tenderness.  Abdominal:     General: Bowel sounds are normal. There is no distension.     Palpations: Abdomen is soft. There is no mass.     Tenderness: There is no abdominal tenderness. There is no guarding or rebound.  Musculoskeletal:        General: No tenderness or deformity. Normal range of motion.     Cervical back: Normal range of motion and neck supple.  Lymphadenopathy:     Cervical: No cervical adenopathy.  Skin:    General: Skin is warm and dry.     Coloration: Skin is not pale.     Findings: No erythema or rash.  Neurological:     Mental Status: He is alert.     Cranial Nerves: No cranial nerve deficit.     Motor: No abnormal muscle tone.     Coordination: Coordination normal.     Deep Tendon Reflexes: Reflexes are normal and symmetric.  Psychiatric:        Behavior: Behavior  normal.        Thought Content: Thought content normal.        Judgment: Judgment normal.        Assessment/Plan: 1. Encounter for routine adult health examination with abnormal findings  2. Essential hypertension - amLODipine (NORVASC) 10 MG tablet; Take 1 tablet (10 mg total) by mouth daily.  Dispense: 90 tablet; Refill: 1 - hydrochlorothiazide (HYDRODIURIL) 25 MG tablet; Take 1 tablet (25 mg total)  by mouth every morning.  Dispense: 90 tablet; Refill: 1 - losartan (COZAAR) 50 MG tablet; Take 1 tablet (50 mg total) by mouth every morning.  Dispense: 90 tablet; Refill: 1  3. Mixed hyperlipidemia     General Counseling: Vint verbalizes understanding of the findings of todays visit and agrees with plan of treatment. I have discussed any further diagnostic evaluation that may be needed or ordered today. We also reviewed his medications today. he has been encouraged to call the office with any questions or concerns that should arise related to todays visit.    No orders of the defined types were placed in this encounter.   Meds ordered this encounter  Medications   amLODipine (NORVASC) 10 MG tablet    Sig: Take 1 tablet (10 mg total) by mouth daily.    Dispense:  90 tablet    Refill:  1   hydrochlorothiazide (HYDRODIURIL) 25 MG tablet    Sig: Take 1 tablet (25 mg total) by mouth every morning.    Dispense:  90 tablet    Refill:  1   losartan (COZAAR) 50 MG tablet    Sig: Take 1 tablet (50 mg total) by mouth every morning.    Dispense:  90 tablet    Refill:  1    Return in about 6 months (around 06/09/2022) for F/U, med refill, Tremel Setters PCP.   Total time spent:*** Minutes Time spent includes review of chart, medications, test results, and follow up plan with the patient.   [place] Controlled Substance Database was reviewed by me.  This patient was seen by Jonetta Osgood, FNP-C in collaboration with Dr. Clayborn Bigness as a part of collaborative care agreement.  Isabelly Kobler R.  Valetta Fuller, MSN, FNP-C Internal medicine

## 2021-12-10 ENCOUNTER — Encounter: Payer: Self-pay | Admitting: Nurse Practitioner

## 2022-06-09 ENCOUNTER — Ambulatory Visit: Payer: 59 | Admitting: Nurse Practitioner

## 2022-06-09 ENCOUNTER — Other Ambulatory Visit: Payer: Self-pay | Admitting: Nurse Practitioner

## 2022-06-09 DIAGNOSIS — I10 Essential (primary) hypertension: Secondary | ICD-10-CM

## 2022-06-26 ENCOUNTER — Ambulatory Visit: Payer: 59 | Admitting: Nurse Practitioner

## 2022-07-19 ENCOUNTER — Encounter: Payer: Self-pay | Admitting: Nurse Practitioner

## 2022-07-19 ENCOUNTER — Ambulatory Visit (INDEPENDENT_AMBULATORY_CARE_PROVIDER_SITE_OTHER): Payer: 59 | Admitting: Nurse Practitioner

## 2022-07-19 VITALS — BP 140/78 | HR 79 | Temp 98.7°F | Resp 16 | Ht 70.0 in | Wt 237.2 lb

## 2022-07-19 DIAGNOSIS — Z6833 Body mass index (BMI) 33.0-33.9, adult: Secondary | ICD-10-CM | POA: Diagnosis not present

## 2022-07-19 DIAGNOSIS — E782 Mixed hyperlipidemia: Secondary | ICD-10-CM | POA: Diagnosis not present

## 2022-07-19 DIAGNOSIS — E6609 Other obesity due to excess calories: Secondary | ICD-10-CM | POA: Diagnosis not present

## 2022-07-19 DIAGNOSIS — I10 Essential (primary) hypertension: Secondary | ICD-10-CM | POA: Diagnosis not present

## 2022-07-19 MED ORDER — HYDROCHLOROTHIAZIDE 25 MG PO TABS: 25.0000 mg | ORAL_TABLET | Freq: Every morning | ORAL | 1 refills | Status: DC

## 2022-07-19 MED ORDER — LOSARTAN POTASSIUM 50 MG PO TABS: 50.0000 mg | ORAL_TABLET | Freq: Every morning | ORAL | 1 refills | Status: DC

## 2022-07-19 MED ORDER — AMLODIPINE BESYLATE 10 MG PO TABS: 10.0000 mg | ORAL_TABLET | Freq: Every day | ORAL | 1 refills | Status: DC

## 2022-07-19 NOTE — Progress Notes (Signed)
[facility] [address] [place], [place] [address]  Internal MEDICINE  Office Visit Note  Patient Name: Jordan Cross  [id number]  [id number]  Date of Service: 07/19/2022  Chief Complaint  Patient presents with   Hypertension   Follow-up    Med refill     HPI Demitris presents for a follow-up visit for hypertension and refills  Blood pressure elelvated rechecked and was stable.  Waiting for life insurance still, declined labs.   Current Medication: Outpatient Encounter Medications as of 07/19/2022  Medication Sig   [DISCONTINUED] amLODipine (NORVASC) 10 MG tablet TAKE ONE TABLET BY MOUTH ONCE A DAY   [DISCONTINUED] hydrochlorothiazide (HYDRODIURIL) 25 MG tablet TAKE ONE TABLET BY MOUTH EVERY MORNING   [DISCONTINUED] losartan (COZAAR) 50 MG tablet TAKE ONE TABLET BY MOUTH EVERY MORNING   amLODipine (NORVASC) 10 MG tablet Take 1 tablet (10 mg total) by mouth daily.   hydrochlorothiazide (HYDRODIURIL) 25 MG tablet Take 1 tablet (25 mg total) by mouth every morning.   losartan (COZAAR) 50 MG tablet Take 1 tablet (50 mg total) by mouth every morning.   No facility-administered encounter medications on file as of 07/19/2022.    Surgical History: Past Surgical History:  Procedure Laterality Date   MULTIPLE TOOTH EXTRACTIONS     ORIF ANKLE FRACTURE Right 12/11/2017   Procedure: OPEN REDUCTION INTERNAL FIXATION (ORIF) RIGHT ANKLE FRACTURE;  Surgeon: Myrene Galas, MD;  Location: [facility] OR;  Service: Orthopedics;  Laterality: Right;    Medical History: Past Medical History:  Diagnosis Date   Family history of adverse reaction to anesthesia    nausea   Fracture    fracture   Hypertension    Pneumonia    as a child    Family History: Family History  Problem Relation Age of Onset   Pancreatic cancer Father     Social History   Socioeconomic History   Marital status: Married    Spouse name: Not on file   Number of children: Not on file   Years of education: Not on  file   Highest education level: Not on file  Occupational History   Not on file  Tobacco Use   Smoking status: Former    Types: Cigarettes   Smokeless tobacco: Never   Tobacco comments:     " quit smoking cigarettes in my 20's"  Vaping Use   Vaping Use: Never used  Substance and Sexual Activity   Alcohol use: Yes    Comment: occ.   Drug use: Yes    Types: Marijuana   Sexual activity: Not on file  Other Topics Concern   Not on file  Social History Narrative   Not on file   Social Determinants of Health   Financial Resource Strain: Not on file  Food Insecurity: Not on file  Transportation Needs: Not on file  Physical Activity: Not on file  Stress: Not on file  Social Connections: Not on file  Intimate Partner Violence: Not on file      Review of Systems  Constitutional:  Negative for chills, fatigue and unexpected weight change.  HENT:  Negative for congestion, rhinorrhea, sneezing and sore throat.   Eyes:  Negative for redness.  Respiratory:  Negative for cough, chest tightness and shortness of breath.   Cardiovascular:  Negative for chest pain and palpitations.  Gastrointestinal:  Negative for abdominal pain, constipation, diarrhea, nausea and vomiting.  Genitourinary:  Negative for dysuria and frequency.  Musculoskeletal:  Negative for arthralgias, back pain, joint  swelling and neck pain.  Skin:  Negative for rash.  Neurological: Negative.  Negative for tremors and numbness.  Hematological:  Negative for adenopathy. Does not bruise/bleed easily.  Psychiatric/Behavioral:  Negative for behavioral problems (Depression), sleep disturbance and suicidal ideas. The patient is not nervous/anxious.     Vital Signs: BP (!) 140/78 Comment: 154/94  Pulse 79   Temp 98.7 F (37.1 C)   Resp 16   Ht 5\' 10"  (1.778 m)   Wt 237 lb 3.2 oz (107.6 kg)   SpO2 99%   BMI 34.03 kg/m    Physical Exam Vitals reviewed.  Constitutional:      General: He is not in acute  distress.    Appearance: Normal appearance. He is obese. He is not ill-appearing.  HENT:     Head: Normocephalic and atraumatic.  Eyes:     Pupils: Pupils are equal, round, and reactive to light.  Cardiovascular:     Rate and Rhythm: Normal rate and regular rhythm.  Pulmonary:     Effort: Pulmonary effort is normal. No respiratory distress.  Neurological:     Mental Status: He is alert and oriented to person, place, and time.  Psychiatric:        Mood and Affect: Mood normal.        Behavior: Behavior normal.        Assessment/Plan: 1. Essential hypertension Continue medications as prescribed. Refills  - amLODipine (NORVASC) 10 MG tablet; Take 1 tablet (10 mg total) by mouth daily.  Dispense: 90 tablet; Refill: 1 - hydrochlorothiazide (HYDRODIURIL) 25 MG tablet; Take 1 tablet (25 mg total) by mouth every morning.  Dispense: 90 tablet; Refill: 1 - losartan (COZAAR) 50 MG tablet; Take 1 tablet (50 mg total) by mouth every morning.  Dispense: 90 tablet; Refill: 1  2. Mixed hyperlipidemia Lab declined, will address again later  3. Class 1 obesity due to excess calories without serious comorbidity with body mass index (BMI) of 33.0 to 33.9 in adult Continue diet and lifestyle modifications as discussed.   General Counseling: Altariq verbalizes understanding of the findings of todays visit and agrees with plan of treatment. I have discussed any further diagnostic evaluation that may be needed or ordered today. We also reviewed his medications today. he has been encouraged to call the office with any questions or concerns that should arise related to todays visit.    No orders of the defined types were placed in this encounter.   Meds ordered this encounter  Medications   amLODipine (NORVASC) 10 MG tablet    Sig: Take 1 tablet (10 mg total) by mouth daily.    Dispense:  90 tablet    Refill:  1   hydrochlorothiazide (HYDRODIURIL) 25 MG tablet    Sig: Take 1 tablet (25 mg total)  by mouth every morning.    Dispense:  90 tablet    Refill:  1   losartan (COZAAR) 50 MG tablet    Sig: Take 1 tablet (50 mg total) by mouth every morning.    Dispense:  90 tablet    Refill:  1    Return in about 6 months (around 01/19/2023) for previously scheduled, CPE, Camrin Gearheart PCP.   Total time spent:30 Minutes Time spent includes review of chart, medications, test results, and follow up plan with the patient.   [place] Controlled Substance Database was reviewed by me.  This patient was seen by Sallyanne Kuster, FNP-C in collaboration with Dr. Beverely Risen as a part of collaborative  care agreement.   Abby Tucholski R. Valetta Fuller, MSN, FNP-C Internal medicine

## 2022-12-14 ENCOUNTER — Telehealth: Payer: Self-pay | Admitting: Nurse Practitioner

## 2022-12-14 NOTE — Telephone Encounter (Signed)
Mailbox full, sent message to move 12/15/22 appointment-Toni

## 2022-12-15 ENCOUNTER — Encounter: Payer: 59 | Admitting: Nurse Practitioner

## 2022-12-21 ENCOUNTER — Encounter: Payer: 59 | Admitting: Nurse Practitioner

## 2022-12-25 ENCOUNTER — Other Ambulatory Visit: Payer: Self-pay | Admitting: Nurse Practitioner

## 2022-12-25 DIAGNOSIS — I10 Essential (primary) hypertension: Secondary | ICD-10-CM

## 2022-12-26 ENCOUNTER — Telehealth: Payer: Self-pay | Admitting: Nurse Practitioner

## 2022-12-26 NOTE — Telephone Encounter (Signed)
Lvm to r/s 12/21/22 cpe to continue med refills-Toni

## 2023-01-23 ENCOUNTER — Other Ambulatory Visit: Payer: Self-pay | Admitting: Nurse Practitioner

## 2023-01-23 DIAGNOSIS — I10 Essential (primary) hypertension: Secondary | ICD-10-CM

## 2023-01-23 MED ORDER — AMLODIPINE BESYLATE 5 MG PO TABS
5.0000 mg | ORAL_TABLET | Freq: Every day | ORAL | 0 refills | Status: DC
Start: 2023-01-23 — End: 2023-04-09

## 2023-01-23 MED ORDER — LOSARTAN POTASSIUM 50 MG PO TABS
50.0000 mg | ORAL_TABLET | Freq: Every day | ORAL | 0 refills | Status: DC
Start: 2023-01-23 — End: 2023-07-04

## 2023-01-23 MED ORDER — HYDROCHLOROTHIAZIDE 25 MG PO TABS
25.0000 mg | ORAL_TABLET | Freq: Every day | ORAL | 0 refills | Status: DC
Start: 2023-01-23 — End: 2023-07-02

## 2023-04-09 ENCOUNTER — Other Ambulatory Visit: Payer: Self-pay | Admitting: Nurse Practitioner

## 2023-04-09 DIAGNOSIS — I10 Essential (primary) hypertension: Secondary | ICD-10-CM

## 2023-04-09 NOTE — Telephone Encounter (Signed)
Pt had several no show last seen 2023

## 2023-05-07 ENCOUNTER — Other Ambulatory Visit: Payer: Self-pay | Admitting: Nurse Practitioner

## 2023-05-07 DIAGNOSIS — I10 Essential (primary) hypertension: Secondary | ICD-10-CM

## 2023-05-07 NOTE — Telephone Encounter (Signed)
Please refused no response from pt for appt

## 2023-05-16 ENCOUNTER — Other Ambulatory Visit: Payer: Self-pay | Admitting: Nurse Practitioner

## 2023-05-16 DIAGNOSIS — I10 Essential (primary) hypertension: Secondary | ICD-10-CM

## 2023-05-16 NOTE — Telephone Encounter (Signed)
 I have repeatedly instructed the patient and his wife that he needs to be seen in the office for future refills. I have also informed him that he will not get any more refills until he is seen in person and he has not called the office nor made an appointment to be seen.

## 2023-05-16 NOTE — Telephone Encounter (Signed)
 Please  refused no next

## 2023-07-02 ENCOUNTER — Other Ambulatory Visit: Payer: Self-pay | Admitting: Nurse Practitioner

## 2023-07-02 DIAGNOSIS — I10 Essential (primary) hypertension: Secondary | ICD-10-CM

## 2023-07-04 ENCOUNTER — Ambulatory Visit (INDEPENDENT_AMBULATORY_CARE_PROVIDER_SITE_OTHER): Payer: Self-pay | Admitting: Nurse Practitioner

## 2023-07-04 ENCOUNTER — Encounter: Payer: Self-pay | Admitting: Nurse Practitioner

## 2023-07-04 VITALS — BP 150/90 | HR 74 | Temp 98.3°F | Resp 16 | Ht 70.0 in | Wt 229.6 lb

## 2023-07-04 DIAGNOSIS — E6609 Other obesity due to excess calories: Secondary | ICD-10-CM

## 2023-07-04 DIAGNOSIS — Z6833 Body mass index (BMI) 33.0-33.9, adult: Secondary | ICD-10-CM

## 2023-07-04 DIAGNOSIS — E782 Mixed hyperlipidemia: Secondary | ICD-10-CM | POA: Diagnosis not present

## 2023-07-04 DIAGNOSIS — E66811 Obesity, class 1: Secondary | ICD-10-CM | POA: Diagnosis not present

## 2023-07-04 DIAGNOSIS — I10 Essential (primary) hypertension: Secondary | ICD-10-CM | POA: Diagnosis not present

## 2023-07-04 MED ORDER — LOSARTAN POTASSIUM 50 MG PO TABS
50.0000 mg | ORAL_TABLET | Freq: Every day | ORAL | 1 refills | Status: DC
Start: 2023-07-04 — End: 2023-12-24

## 2023-07-04 MED ORDER — HYDROCHLOROTHIAZIDE 25 MG PO TABS: 25.0000 mg | ORAL_TABLET | Freq: Every morning | ORAL | 1 refills | Status: DC

## 2023-07-04 MED ORDER — AMLODIPINE BESYLATE 5 MG PO TABS: 5.0000 mg | ORAL_TABLET | Freq: Every day | ORAL | 1 refills | Status: DC

## 2023-07-04 NOTE — Progress Notes (Signed)
 [facility] [address] [place], [place] [address]  Internal MEDICINE  Office Visit Note  Patient Name: Jordan Cross  [id number]  [id number]  Date of Service: 07/04/2023  Chief Complaint  Patient presents with   Hypertension   Follow-up    HPI Colsen presents for a follow-up visit for hypertension, high cholesterol and obesity. Hypertension -- elevated blood pressure today, patient had run out of his medication and had missed and/or cancelled several appts.  Refusing labs still, due to still trying to get life insurance.  Need to check cholesterol but patient will not do labs.  Obesity -- BMI is elevated at 32.94    Current Medication: Outpatient Encounter Medications as of 07/04/2023  Medication Sig   [DISCONTINUED] amLODipine (NORVASC) 5 MG tablet TAKE ONE TABLET (10 MG TOTAL) BY MOUTH DAILY.   [DISCONTINUED] hydrochlorothiazide (HYDRODIURIL) 25 MG tablet TAKE ONE TABLET BY MOUTH EVERY MORNING   [DISCONTINUED] losartan (COZAAR) 50 MG tablet Take 1 tablet (50 mg total) by mouth daily.   amLODipine (NORVASC) 5 MG tablet Take 1 tablet (5 mg total) by mouth daily.   hydrochlorothiazide (HYDRODIURIL) 25 MG tablet Take 1 tablet (25 mg total) by mouth every morning.   losartan (COZAAR) 50 MG tablet Take 1 tablet (50 mg total) by mouth daily.   No facility-administered encounter medications on file as of 07/04/2023.    Surgical History: Past Surgical History:  Procedure Laterality Date   MULTIPLE TOOTH EXTRACTIONS     ORIF ANKLE FRACTURE Right 12/11/2017   Procedure: OPEN REDUCTION INTERNAL FIXATION (ORIF) RIGHT ANKLE FRACTURE;  Surgeon: Myrene Galas, MD;  Location: [facility] OR;  Service: Orthopedics;  Laterality: Right;    Medical History: Past Medical History:  Diagnosis Date   Family history of adverse reaction to anesthesia    nausea   Fracture    fracture   Hypertension    Pneumonia    as a child    Family History: Family History  Problem Relation Age of  Onset   Pancreatic cancer Father     Social History   Socioeconomic History   Marital status: Married    Spouse name: Not on file   Number of children: Not on file   Years of education: Not on file   Highest education level: Not on file  Occupational History   Not on file  Tobacco Use   Smoking status: Former    Types: Cigarettes   Smokeless tobacco: Never   Tobacco comments:     " quit smoking cigarettes in my 20's"  Vaping Use   Vaping status: Never Used  Substance and Sexual Activity   Alcohol use: Yes    Comment: occ.   Drug use: Yes    Types: Marijuana   Sexual activity: Not on file  Other Topics Concern   Not on file  Social History Narrative   Not on file   Social Drivers of Health   Financial Resource Strain: Not on file  Food Insecurity: Not on file  Transportation Needs: Not on file  Physical Activity: Not on file  Stress: Not on file  Social Connections: Not on file  Intimate Partner Violence: Not on file      Review of Systems  Constitutional: Negative.  Negative for chills, fatigue and unexpected weight change.  HENT: Negative.  Negative for congestion, rhinorrhea, sneezing and sore throat.   Eyes:  Negative for redness.  Respiratory: Negative.  Negative for cough, chest tightness, shortness of breath and wheezing.  Cardiovascular: Negative.  Negative for chest pain and palpitations.  Gastrointestinal: Negative.  Negative for abdominal pain, constipation, diarrhea, nausea and vomiting.  Genitourinary:  Negative for dysuria and frequency.  Musculoskeletal: Negative.  Negative for arthralgias, back pain, joint swelling and neck pain.  Skin:  Negative for rash.  Neurological: Negative.  Negative for tremors and numbness.  Hematological:  Negative for adenopathy. Does not bruise/bleed easily.  Psychiatric/Behavioral:  Negative for behavioral problems (Depression), sleep disturbance and suicidal ideas. The patient is not nervous/anxious.     Vital  Signs: BP (!) 150/90   Pulse 74   Temp 98.3 F (36.8 C)   Resp 16   Ht 5\' 10"  (1.778 m)   Wt 229 lb 9.6 oz (104.1 kg)   SpO2 98%   BMI 32.94 kg/m    Physical Exam Vitals reviewed.  Constitutional:      General: He is not in acute distress.    Appearance: Normal appearance. He is obese. He is not ill-appearing.  HENT:     Head: Normocephalic and atraumatic.  Eyes:     Pupils: Pupils are equal, round, and reactive to light.  Cardiovascular:     Rate and Rhythm: Normal rate and regular rhythm.  Pulmonary:     Effort: Pulmonary effort is normal. No respiratory distress.  Neurological:     Mental Status: He is alert and oriented to person, place, and time.  Psychiatric:        Mood and Affect: Mood normal.        Behavior: Behavior normal.        Assessment/Plan: 1. Essential hypertension (Primary) Elevated today without being on all of his medication. Refills ordered. BP is controlled on all 3 medications as prescribed.  - amLODipine (NORVASC) 5 MG tablet; Take 1 tablet (5 mg total) by mouth daily.  Dispense: 90 tablet; Refill: 1 - hydrochlorothiazide (HYDRODIURIL) 25 MG tablet; Take 1 tablet (25 mg total) by mouth every morning.  Dispense: 90 tablet; Refill: 1 - losartan (COZAAR) 50 MG tablet; Take 1 tablet (50 mg total) by mouth daily.  Dispense: 90 tablet; Refill: 1  2. Mixed hyperlipidemia Will readdress needing labs drawn at a future visit  3. Class 1 obesity due to excess calories without serious comorbidity with body mass index (BMI) of 33.0 to 33.9 in adult Encourage healthy balanced diet and regular exercise.    General Counseling: Zylon verbalizes understanding of the findings of todays visit and agrees with plan of treatment. I have discussed any further diagnostic evaluation that may be needed or ordered today. We also reviewed his medications today. he has been encouraged to call the office with any questions or concerns that should arise related to todays  visit.    No orders of the defined types were placed in this encounter.   Meds ordered this encounter  Medications   amLODipine (NORVASC) 5 MG tablet    Sig: Take 1 tablet (5 mg total) by mouth daily.    Dispense:  90 tablet    Refill:  1    For future refills   hydrochlorothiazide (HYDRODIURIL) 25 MG tablet    Sig: Take 1 tablet (25 mg total) by mouth every morning.    Dispense:  90 tablet    Refill:  1    For future refills   losartan (COZAAR) 50 MG tablet    Sig: Take 1 tablet (50 mg total) by mouth daily.    Dispense:  90 tablet    Refill:  1    For future refills    Return in about 6 months (around 01/03/2024) for CPE, Kemper Hochman PCP and refills again .   Total time spent:30 Minutes Time spent includes review of chart, medications, test results, and follow up plan with the patient.   [place] Controlled Substance Database was reviewed by me.  This patient was seen by Laurence Pons, FNP-C in collaboration with Dr. Verneta Gone as a part of collaborative care agreement.   Quandra Fedorchak R. Bobbi Burow, MSN, FNP-C Internal medicine

## 2023-12-24 ENCOUNTER — Other Ambulatory Visit: Payer: Self-pay | Admitting: Nurse Practitioner

## 2023-12-24 DIAGNOSIS — I10 Essential (primary) hypertension: Secondary | ICD-10-CM

## 2024-01-03 ENCOUNTER — Encounter: Admitting: Internal Medicine

## 2024-01-17 ENCOUNTER — Telehealth: Payer: Self-pay | Admitting: Nurse Practitioner

## 2024-01-17 NOTE — Telephone Encounter (Signed)
 Attempted to contact patient to r/s 01/03/24 missed appointment.  No answer, MB full-Jordan Cross

## 2024-02-20 ENCOUNTER — Telehealth: Payer: Self-pay | Admitting: Nurse Practitioner

## 2024-02-20 NOTE — Telephone Encounter (Signed)
 Unable to reach patient regarding missed appointment. Letter mailed-Jordan Cross

## 2024-03-19 ENCOUNTER — Other Ambulatory Visit: Payer: Self-pay | Admitting: Nurse Practitioner

## 2024-03-19 DIAGNOSIS — I10 Essential (primary) hypertension: Secondary | ICD-10-CM

## 2024-03-25 ENCOUNTER — Encounter: Payer: Self-pay | Admitting: Nurse Practitioner

## 2024-03-25 VITALS — BP 150/70 | HR 78 | Temp 96.9°F | Resp 16 | Ht 70.0 in | Wt 243.4 lb

## 2024-03-25 DIAGNOSIS — E782 Mixed hyperlipidemia: Secondary | ICD-10-CM

## 2024-03-25 DIAGNOSIS — Z125 Encounter for screening for malignant neoplasm of prostate: Secondary | ICD-10-CM

## 2024-03-25 DIAGNOSIS — Z833 Family history of diabetes mellitus: Secondary | ICD-10-CM | POA: Insufficient documentation

## 2024-03-25 DIAGNOSIS — Z1212 Encounter for screening for malignant neoplasm of rectum: Secondary | ICD-10-CM | POA: Diagnosis not present

## 2024-03-25 DIAGNOSIS — Z6833 Body mass index (BMI) 33.0-33.9, adult: Secondary | ICD-10-CM | POA: Diagnosis not present

## 2024-03-25 DIAGNOSIS — E6609 Other obesity due to excess calories: Secondary | ICD-10-CM

## 2024-03-25 DIAGNOSIS — Z0001 Encounter for general adult medical examination with abnormal findings: Secondary | ICD-10-CM | POA: Diagnosis not present

## 2024-03-25 DIAGNOSIS — Z1211 Encounter for screening for malignant neoplasm of colon: Secondary | ICD-10-CM

## 2024-03-25 DIAGNOSIS — Z8349 Family history of other endocrine, nutritional and metabolic diseases: Secondary | ICD-10-CM | POA: Diagnosis not present

## 2024-03-25 DIAGNOSIS — E66811 Obesity, class 1: Secondary | ICD-10-CM

## 2024-03-25 DIAGNOSIS — I10 Essential (primary) hypertension: Secondary | ICD-10-CM

## 2024-03-25 MED ORDER — AMLODIPINE BESYLATE 5 MG PO TABS: 5.0000 mg | ORAL_TABLET | Freq: Every day | ORAL | 1 refills | Status: AC

## 2024-03-25 MED ORDER — LOSARTAN POTASSIUM 50 MG PO TABS: 50.0000 mg | ORAL_TABLET | Freq: Every day | ORAL | 1 refills | Status: AC

## 2024-03-25 NOTE — Progress Notes (Signed)
 [facility] [address] [place], [place] [address]  Internal MEDICINE  Office Visit Note  Patient Name: Jordan Cross  [id number]  [id number]  Date of Service: 03/25/2024  Chief Complaint  Patient presents with   Hypertension   Annual Exam     [name] presents for an annual well visit and physical exam.  Well-appearing 59 y.o. male with hypertension and high cholesterol.  Routine CRC screening: over due now, no family history of colon cancer, opt for cologuard.  He does have a family history of type 2 diabetes and hypothyroidism in his mother and his brother also has hypothyroidism.  He is due for PSA lab for prostate cancer screening, and he does not have any family history of prostate cancer.  Labs: overdue for labs. He has finally agreed to get his labs done this year.  New or worsening pain: none  Other concerns: none We discussed at length the importance of screenings and annual labs.     Current Medication: Outpatient Encounter Medications as of 03/25/2024  Medication Sig   amLODipine  (NORVASC ) 5 MG tablet Take 1 tablet (5 mg total) by mouth daily.   hydrochlorothiazide  (HYDRODIURIL ) 25 MG tablet TAKE 1 TABLET (25 MG TOTAL) BY MOUTH EVERY MORNING.   losartan  (COZAAR ) 50 MG tablet Take 1 tablet (50 mg total) by mouth daily.   [DISCONTINUED] amLODipine  (NORVASC ) 5 MG tablet TAKE ONE TABLET (5 MG TOTAL) BY MOUTH DAILY.   [DISCONTINUED] losartan  (COZAAR ) 50 MG tablet TAKE ONE TABLET (50 MG TOTAL) BY MOUTH DAILY.   No facility-administered encounter medications on file as of 03/25/2024.    Surgical History: Past Surgical History:  Procedure Laterality Date   MULTIPLE TOOTH EXTRACTIONS     ORIF ANKLE FRACTURE Right 12/11/2017   Procedure: OPEN REDUCTION INTERNAL FIXATION (ORIF) RIGHT ANKLE FRACTURE;  Surgeon: Celena Sharper, MD;  Location: [facility] OR;  Service: Orthopedics;  Laterality: Right;    Medical History: Past Medical History:  Diagnosis Date   Family history  of adverse reaction to anesthesia    nausea   Fracture    fracture   Hypertension    Pneumonia    as a child    Family History: Family History  Problem Relation Age of Onset   Pancreatic cancer Father     Social History   Socioeconomic History   Marital status: Married    Spouse name: Not on file   Number of children: Not on file   Years of education: Not on file   Highest education level: Not on file  Occupational History   Not on file  Tobacco Use   Smoking status: Former    Types: Cigarettes   Smokeless tobacco: Never   Tobacco comments:      quit smoking cigarettes in my 20's  Vaping Use   Vaping status: Never Used  Substance and Sexual Activity   Alcohol use: Yes    Comment: occ.   Drug use: Yes    Types: Marijuana   Sexual activity: Not on file  Other Topics Concern   Not on file  Social History Narrative   Not on file   Social Drivers of Health   Tobacco Use: Medium Risk (03/25/2024)   Patient History    Smoking Tobacco Use: Former    Smokeless Tobacco Use: Never    Passive Exposure: Not on Actuary Strain: Not on file  Food Insecurity: Not on file  Transportation Needs: Not on file  Physical Activity: Not on  file  Stress: Not on file  Social Connections: Not on file  Intimate Partner Violence: Not on file  Depression [phone]): Low Risk (03/25/2024)   Depression (PHQ2-9)    PHQ-2 Score: 0  Alcohol Screen: Low Risk (12/09/2021)   Alcohol Screen    Last Alcohol Screening Score (AUDIT): 2  Housing: Not on file  Utilities: Not on file  Health Literacy: Not on file      Review of Systems  Constitutional:  Negative for activity change, appetite change, chills, fatigue, fever and unexpected weight change.  HENT: Negative.  Negative for congestion, ear pain, rhinorrhea, sore throat and trouble swallowing.   Eyes: Negative.   Respiratory: Negative.  Negative for cough, chest tightness, shortness of breath and wheezing.    Cardiovascular: Negative.  Negative for chest pain.  Gastrointestinal: Negative.  Negative for abdominal pain, blood in stool, constipation, diarrhea, nausea and vomiting.  Endocrine: Negative.   Genitourinary: Negative.  Negative for difficulty urinating, dysuria, frequency, hematuria and urgency.  Musculoskeletal: Negative.  Negative for arthralgias, back pain, joint swelling, myalgias and neck pain.  Skin: Negative.  Negative for rash and wound.  Allergic/Immunologic: Negative.  Negative for immunocompromised state.  Neurological: Negative.  Negative for dizziness, seizures, numbness and headaches.  Hematological: Negative.   Psychiatric/Behavioral: Negative.  Negative for behavioral problems, self-injury and suicidal ideas. The patient is not nervous/anxious.     Vital Signs: BP (!) 170/110   Pulse 78   Temp (!) 96.9 F (36.1 C)   Resp 16   Ht 5' 10 (1.778 m)   Wt 243 lb 6.4 oz (110.4 kg)   SpO2 96%   BMI 34.92 kg/m    Physical Exam Vitals reviewed.  Constitutional:      General: He is awake. He is not in acute distress.    Appearance: Normal appearance. He is well-developed and well-groomed. He is obese. He is not ill-appearing or diaphoretic.  HENT:     Head: Normocephalic and atraumatic.     Right Ear: Tympanic membrane, ear canal and external ear normal.     Left Ear: Tympanic membrane, ear canal and external ear normal.     Nose: Nose normal. No congestion or rhinorrhea.     Mouth/Throat:     Lips: Pink.     Mouth: Mucous membranes are moist.     Pharynx: Oropharynx is clear. Uvula midline. No oropharyngeal exudate or posterior oropharyngeal erythema.  Eyes:     General: Lids are normal. Vision grossly intact. Gaze aligned appropriately. No scleral icterus.       Right eye: No discharge.        Left eye: No discharge.     Extraocular Movements: Extraocular movements intact.     Conjunctiva/sclera: Conjunctivae normal.     Pupils: Pupils are equal, round, and  reactive to light.     Funduscopic exam:    Right eye: Red reflex present.        Left eye: Red reflex present. Neck:     Thyroid: No thyromegaly.     Vascular: No JVD.     Trachea: Trachea and phonation normal. No tracheal deviation.  Cardiovascular:     Rate and Rhythm: Normal rate and regular rhythm.     Pulses: Normal pulses.     Heart sounds: Normal heart sounds, S1 normal and S2 normal. No murmur heard.    No friction rub. No gallop.  Pulmonary:     Effort: Pulmonary effort is normal. No accessory muscle usage or  respiratory distress.     Breath sounds: Normal breath sounds and air entry. No stridor. No wheezing or rales.  Chest:     Chest wall: No tenderness.  Abdominal:     General: Bowel sounds are normal. There is no distension.     Palpations: Abdomen is soft. There is no shifting dullness, fluid wave, mass or pulsatile mass.     Tenderness: There is no abdominal tenderness. There is no guarding or rebound.  Musculoskeletal:        General: No tenderness or deformity. Normal range of motion.     Cervical back: Normal range of motion and neck supple.     Right lower leg: No edema.     Left lower leg: No edema.  Lymphadenopathy:     Cervical: No cervical adenopathy.  Skin:    General: Skin is warm and dry.     Capillary Refill: Capillary refill takes less than 2 seconds.     Coloration: Skin is not pale.     Findings: No erythema or rash.  Neurological:     Mental Status: He is alert and oriented to person, place, and time.     Cranial Nerves: No cranial nerve deficit.     Motor: No abnormal muscle tone.     Coordination: Coordination normal.     Gait: Gait normal.     Deep Tendon Reflexes: Reflexes are normal and symmetric.  Psychiatric:        Mood and Affect: Mood and affect normal.        Behavior: Behavior normal. Behavior is cooperative.        Thought Content: Thought content normal.        Judgment: Judgment normal.        Assessment/Plan: 1.  Encounter for routine adult health examination with abnormal findings (Primary) Age-appropriate preventive screenings and vaccinations discussed, annual physical exam completed. Routine labs for health maintenance ordered, see below. PHM updated.   - CBC with Differential/Platelet - CMP14+EGFR - Lipid Profile - PSA Total (Reflex To Free) - TSH + free T4 - Hgb A1C w/o eAG  2. Essential hypertension Slightly elevated but his medication has not taken full effect yet. Continue losartan  and amlodipine  as prescribed. Routine labs ordered  - losartan  (COZAAR ) 50 MG tablet; Take 1 tablet (50 mg total) by mouth daily.  Dispense: 90 tablet; Refill: 1 - amLODipine  (NORVASC ) 5 MG tablet; Take 1 tablet (5 mg total) by mouth daily.  Dispense: 90 tablet; Refill: 1 - CBC with Differential/Platelet - CMP14+EGFR - Lipid Profile  3. Mixed hyperlipidemia Continue statin therapy as prescribed. Routine labs ordered  - CBC with Differential/Platelet - CMP14+EGFR - Lipid Profile  4. Class 1 obesity due to excess calories without serious comorbidity with body mass index (BMI) of 33.0 to 33.9 in adult Routine labs ordered.  - CBC with Differential/Platelet - CMP14+EGFR - Lipid Profile - TSH + free T4  5. Family history of diabetes mellitus Routine labs ordered  - CBC with Differential/Platelet - CMP14+EGFR - Lipid Profile - TSH + free T4 - Hgb A1C w/o eAG  6. Family history of hypothyroidism Routine labs ordered  - CBC with Differential/Platelet - CMP14+EGFR - Lipid Profile - TSH + free T4 - Hgb A1C w/o eAG  7. Screening for prostate cancer PSA lab ordered  - PSA Total (Reflex To Free)  8. Screening for colorectal cancer Cologuard test ordered  - Cologuard     General Counseling: Kyce verbalizes understanding of the findings  of todays visit and agrees with plan of treatment. I have discussed any further diagnostic evaluation that may be needed or ordered today. We also reviewed his  medications today. he has been encouraged to call the office with any questions or concerns that should arise related to todays visit.    Orders Placed This Encounter  Procedures   CBC with Differential/Platelet   CMP14+EGFR   Lipid Profile   PSA Total (Reflex To Free)   TSH + free T4   Cologuard   Hgb A1C w/o eAG    Meds ordered this encounter  Medications   losartan  (COZAAR ) 50 MG tablet    Sig: Take 1 tablet (50 mg total) by mouth daily.    Dispense:  90 tablet    Refill:  1   amLODipine  (NORVASC ) 5 MG tablet    Sig: Take 1 tablet (5 mg total) by mouth daily.    Dispense:  90 tablet    Refill:  1    Return in about 1 month (around 04/25/2024) for F/U, Labs, Rex Oesterle PCP.   Total time spent:30 Minutes Time spent includes review of chart, medications, test results, and follow up plan with the patient.   [place] Controlled Substance Database was reviewed by me.  This patient was seen by Mardy Maxin, FNP-C in collaboration with Dr. Sigrid Bathe as a part of collaborative care agreement.  Dreyton Roessner R. Maxin, MSN, FNP-C Internal medicine

## 2024-04-23 ENCOUNTER — Telehealth: Payer: Self-pay | Admitting: Nurse Practitioner

## 2024-04-23 NOTE — Telephone Encounter (Signed)
 Attempted to contact patient regarding labs not done for 04/25/24 appointment. No answer. MB full-Toni

## 2024-04-25 ENCOUNTER — Ambulatory Visit: Admitting: Nurse Practitioner

## 2025-03-26 ENCOUNTER — Encounter: Admitting: Nurse Practitioner
